# Patient Record
Sex: Female | Born: 1935
Health system: Southern US, Community
[De-identification: ages and names within clinical notes are randomized; demographics above are authoritative.]

## PROBLEM LIST (undated history)

## (undated) DIAGNOSIS — H269 Unspecified cataract: Secondary | ICD-10-CM

## (undated) DIAGNOSIS — M1990 Unspecified osteoarthritis, unspecified site: Secondary | ICD-10-CM

## (undated) DIAGNOSIS — I10 Essential (primary) hypertension: Secondary | ICD-10-CM

## (undated) DIAGNOSIS — E7800 Pure hypercholesterolemia, unspecified: Secondary | ICD-10-CM

---

## 2000-01-08 ENCOUNTER — Ambulatory Visit (HOSPITAL_COMMUNITY): Admission: RE | Admit: 2000-01-08 | Discharge: 2000-01-08 | Payer: Self-pay | Admitting: Family Medicine

## 2000-01-08 ENCOUNTER — Encounter: Payer: Self-pay | Admitting: Family Medicine

## 2000-07-07 ENCOUNTER — Ambulatory Visit (HOSPITAL_COMMUNITY): Admission: RE | Admit: 2000-07-07 | Discharge: 2000-07-07 | Payer: Self-pay | Admitting: Family Medicine

## 2000-07-07 ENCOUNTER — Other Ambulatory Visit: Admission: RE | Admit: 2000-07-07 | Discharge: 2000-07-07 | Payer: Self-pay | Admitting: Family Medicine

## 2002-06-19 ENCOUNTER — Other Ambulatory Visit: Admission: RE | Admit: 2002-06-19 | Discharge: 2002-06-19 | Payer: Self-pay | Admitting: Family Medicine

## 2002-07-20 ENCOUNTER — Encounter: Payer: Self-pay | Admitting: Family Medicine

## 2002-07-20 ENCOUNTER — Ambulatory Visit (HOSPITAL_COMMUNITY): Admission: RE | Admit: 2002-07-20 | Discharge: 2002-07-20 | Payer: Self-pay | Admitting: Family Medicine

## 2004-01-16 ENCOUNTER — Encounter: Admission: RE | Admit: 2004-01-16 | Discharge: 2004-01-16 | Payer: Self-pay | Admitting: Family Medicine

## 2004-02-27 ENCOUNTER — Ambulatory Visit (HOSPITAL_COMMUNITY): Admission: RE | Admit: 2004-02-27 | Discharge: 2004-02-27 | Payer: Self-pay | Admitting: Gastroenterology

## 2004-10-15 ENCOUNTER — Ambulatory Visit (HOSPITAL_COMMUNITY): Admission: RE | Admit: 2004-10-15 | Discharge: 2004-10-15 | Payer: Self-pay | Admitting: Ophthalmology

## 2007-02-08 ENCOUNTER — Ambulatory Visit (HOSPITAL_COMMUNITY): Admission: RE | Admit: 2007-02-08 | Discharge: 2007-02-08 | Payer: Self-pay | Admitting: Ophthalmology

## 2010-08-03 ENCOUNTER — Encounter: Admission: RE | Admit: 2010-08-03 | Discharge: 2010-08-03 | Payer: Self-pay | Admitting: Family Medicine

## 2011-05-14 NOTE — Op Note (Signed)
NAME:  Amber Freeman, Amber Freeman NO.:  [id number]   MEDICAL RECORD NO.:  [id number]          PATIENT TYPE:  OIB   LOCATION:  2899                         FACILITY:  [facility]   PHYSICIAN:  Salley Scarlet., M.D.DATE OF BIRTH:  02/03/1932   DATE OF PROCEDURE:  DATE OF DISCHARGE:  10/15/2004                                 OPERATIVE REPORT   PREOPERATIVE DIAGNOSIS:  Immature cataract, right eye.   POSTOPERATIVE DIAGNOSIS:  Immature cataract, right eye.   PROCEDURE:  Kelman phacoemulsification cataract, right eye with intraocular  lens implantation.   SURGEON:  Nadyne Coombes, M.D.   ANESTHESIA:  Local using 2% Xylocaine, Marcaine 0.75% with Wydase.   JUSTIFICATION FOR PROCEDURE:  This is a 77 year old lady who complains of  blurring of vision with difficulty seeing to read. She was evaluated and  found to have bilateral immature cataracts, slightly worse on the right than  the left.  Her vision was best corrected to 20/60 on the right, 20/50 on the  left.  Cataract extraction with intraocular lens implantation was  recommended. She was admitted at this time for the appropriate procedure.   DESCRIPTION OF PROCEDURE:  Under the influence of IV sedation, the VanLint  akinesia and retrobulbar anesthesia were given.  The patient was prepped and  draped in the usual manner.  The lid speculum was inserted under the  upper/lower lid of the right eye and a 4-0 silk traction suture was passed  through the belly of the superior rectus muscle for traction.  A fornix  based conjunctival flap was turned and hemostasis was achieved with the use  of cautery.  An incision was made in the sclera approximately 1 mm from the  limbus. This incision was dissected down to clear cornea using the crescent  blade.  A side-port incision was made at the 1:30 o'clock position. Occucoat  was injected into the eye through this  side-port incision.  The anterior chamber was then entered through  the  corneoscleral incision at the 11:30 o'clock position using a 2.7 mm  keratome.   An anterior capsulotomy was then done using a bent 25 gauge needle.  The nucleus was hydrodissected using Xylocaine.  The KTP hand-piece was  fastened to the eye, and the nucleus was emulsified without difficulty.  The  residual cortical material was aspirated.  The posterior capsule was  polished using olive-tipped polisher.   The wound was widened slightly to accommodate a foldable, silicone lens.  This lens was seated into the eye behind the iris without difficulty.  The  anterior chamber was reformed and the pupil was constricted using Miochol.  The ellipse of the wound were hydrated and tested to make sure that there  was no leak.  After ascertaining that there was no leak,  the conjunctiva  was closed using thermal cautery.  One cc of Celestone, 0.5 cc of gentamicin  were injected in the subconjunctiva.  Maxitrol ophthalmic ointment and  Pilopine ointment were applied along with a patch and Fox shield.  The  patient tolerated the procedure well and was discharged to  the  postanesthesia recovery room in satisfactory condition.   She was instructed to rest today, to take Vicodin every 4 hours as needed  for pain and to see me in the office tomorrow for further evaluation.   DISCHARGE DIAGNOSIS:  Immature cataract in the right eye.       TB/MEDQ  D:  10/15/2004  T:  10/15/2004  Job:  [id number]

## 2011-05-14 NOTE — Op Note (Signed)
NAME:  Amber Freeman, Amber Freeman                           ACCOUNT NO.:  [id number]   MEDICAL RECORD NO.:  [id number]                   PATIENT TYPE:  AMB   LOCATION:  ENDO                                 FACILITY:  [facility]   PHYSICIAN:  Graylin Shiver, M.D.                DATE OF BIRTH:  [birth year]   DATE OF PROCEDURE:  02/27/2004  DATE OF DISCHARGE:                                 OPERATIVE REPORT   PROCEDURE:  Colonoscopy.   INDICATIONS FOR PROCEDURE:  Chronic constipation, screening.   Informed consent was obtained after explanation of the risks of bleeding,  infection and perforation.   PREMEDICATION:  Fentanyl 60 mcg IV, Versed 7 mg IV.   DESCRIPTION OF PROCEDURE:  With the patient in the left lateral decubitus  position, a rectal exam was performed and no masses were felt. The Olympus  colonoscope was then inserted into the rectum and advanced around the colon  to the cecum. Cecal landmarks were identified. The colon was extremely  tortous.  I had to place the patient on her right side and on her back to  achieve intubation of the cecum as the colon was extremely tortous.  The  scope was brought out visualizing the colonic mucosa.  The cecum and  ascending colon looked normal. The transverse colon looked normal.  The  descending colon, sigmoid and rectum looked normal. She tolerated the  procedure well without complications.   IMPRESSION:  Normal colonoscopy to the cecum with a very tortuous colon  present.                                               Graylin Shiver, M.D.    Germain Osgood  D:  02/27/2004  T:  02/27/2004  Job:  [id number]   cc:   Renaye Rakers, M.D.  [phone] [address]  [place]  [place] [address]  Fax: [phone]

## 2011-06-30 ENCOUNTER — Emergency Department (HOSPITAL_COMMUNITY)
Admission: EM | Admit: 2011-06-30 | Discharge: 2011-06-30 | Disposition: A | Discharge status: Home / Self Care | Payer: Medicare Other | Attending: Emergency Medicine | Admitting: Emergency Medicine

## 2011-06-30 ENCOUNTER — Emergency Department (HOSPITAL_COMMUNITY): Payer: Medicare Other

## 2011-06-30 DIAGNOSIS — M25569 Pain in unspecified knee: Secondary | ICD-10-CM | POA: Insufficient documentation

## 2011-06-30 DIAGNOSIS — I10 Essential (primary) hypertension: Secondary | ICD-10-CM | POA: Insufficient documentation

## 2011-06-30 DIAGNOSIS — E7800 Pure hypercholesterolemia, unspecified: Secondary | ICD-10-CM | POA: Insufficient documentation

## 2011-12-06 ENCOUNTER — Other Ambulatory Visit: Payer: Self-pay | Admitting: Otolaryngology

## 2011-12-06 DIAGNOSIS — K1120 Sialoadenitis, unspecified: Secondary | ICD-10-CM

## 2011-12-10 ENCOUNTER — Other Ambulatory Visit: Payer: Medicare Other

## 2011-12-13 ENCOUNTER — Ambulatory Visit
Admission: RE | Admit: 2011-12-13 | Discharge: 2011-12-13 | Disposition: A | Discharge status: Home / Self Care | Payer: Medicare Other | Source: Ambulatory Visit | Attending: Otolaryngology | Admitting: Otolaryngology

## 2011-12-13 DIAGNOSIS — K1120 Sialoadenitis, unspecified: Secondary | ICD-10-CM

## 2013-06-11 ENCOUNTER — Other Ambulatory Visit (HOSPITAL_COMMUNITY): Payer: Self-pay | Admitting: Family Medicine

## 2013-06-11 DIAGNOSIS — M25561 Pain in right knee: Secondary | ICD-10-CM

## 2013-06-11 DIAGNOSIS — M7989 Other specified soft tissue disorders: Secondary | ICD-10-CM

## 2013-06-12 ENCOUNTER — Ambulatory Visit (HOSPITAL_COMMUNITY)
Admission: RE | Admit: 2013-06-12 | Discharge: 2013-06-12 | Disposition: A | Discharge status: Home / Self Care | Payer: Medicare Other | Source: Ambulatory Visit | Attending: Family Medicine | Admitting: Family Medicine

## 2013-06-12 DIAGNOSIS — M7989 Other specified soft tissue disorders: Secondary | ICD-10-CM

## 2013-06-12 DIAGNOSIS — M25561 Pain in right knee: Secondary | ICD-10-CM

## 2013-06-12 DIAGNOSIS — M79609 Pain in unspecified limb: Secondary | ICD-10-CM | POA: Insufficient documentation

## 2013-06-12 NOTE — Progress Notes (Signed)
Right lower extremity venous duplex completed.  Right:  No evidence of DVT, superficial thrombosis, or Baker's cyst.  Left:  Negative for DVT in the common femoral vein.  

## 2013-07-14 ENCOUNTER — Emergency Department (HOSPITAL_COMMUNITY)
Admission: EM | Admit: 2013-07-14 | Discharge: 2013-07-15 | Disposition: A | Discharge status: Home / Self Care | Payer: Medicare Other | Attending: Emergency Medicine | Admitting: Emergency Medicine

## 2013-07-14 ENCOUNTER — Encounter (HOSPITAL_COMMUNITY): Payer: Self-pay | Admitting: Emergency Medicine

## 2013-07-14 ENCOUNTER — Emergency Department (HOSPITAL_COMMUNITY): Payer: Medicare Other

## 2013-07-14 DIAGNOSIS — I10 Essential (primary) hypertension: Secondary | ICD-10-CM | POA: Insufficient documentation

## 2013-07-14 DIAGNOSIS — M129 Arthropathy, unspecified: Secondary | ICD-10-CM | POA: Insufficient documentation

## 2013-07-14 DIAGNOSIS — Z79899 Other long term (current) drug therapy: Secondary | ICD-10-CM | POA: Insufficient documentation

## 2013-07-14 DIAGNOSIS — R109 Unspecified abdominal pain: Secondary | ICD-10-CM

## 2013-07-14 DIAGNOSIS — R1915 Other abnormal bowel sounds: Secondary | ICD-10-CM | POA: Insufficient documentation

## 2013-07-14 DIAGNOSIS — E7800 Pure hypercholesterolemia, unspecified: Secondary | ICD-10-CM | POA: Insufficient documentation

## 2013-07-14 HISTORY — DX: Essential (primary) hypertension: I10

## 2013-07-14 HISTORY — DX: Pure hypercholesterolemia, unspecified: E78.00

## 2013-07-14 HISTORY — DX: Unspecified osteoarthritis, unspecified site: M19.90

## 2013-07-14 LAB — COMPREHENSIVE METABOLIC PANEL
ALT: 12 U/L (ref 0–35)
AST: 17 U/L (ref 0–37)
Albumin: 4 g/dL (ref 3.5–5.2)
Alkaline Phosphatase: 78 U/L (ref 39–117)
BUN: 10 mg/dL (ref 6–23)
CO2: 28 mEq/L (ref 19–32)
Calcium: 10.2 mg/dL (ref 8.4–10.5)
Chloride: 97 mEq/L (ref 96–112)
Creatinine, Ser: 0.83 mg/dL (ref 0.50–1.10)
GFR calc Af Amer: 76 mL/min — ABNORMAL LOW (ref >90)
GFR calc non Af Amer: 65 mL/min — ABNORMAL LOW (ref >90)
Glucose, Bld: 140 mg/dL — ABNORMAL HIGH (ref 70–99)
Potassium: 3.5 mEq/L (ref 3.5–5.1)
Sodium: 136 mEq/L (ref 135–145)
Total Bilirubin: 0.4 mg/dL (ref 0.3–1.2)
Total Protein: 7.6 g/dL (ref 6.0–8.3)

## 2013-07-14 LAB — CBC WITH DIFFERENTIAL/PLATELET
Basophils Absolute: 0 10*3/uL (ref 0.0–0.1)
Basophils Relative: 0 % (ref 0–1)
Eosinophils Absolute: 0 10*3/uL (ref 0.0–0.7)
Eosinophils Relative: 1 % (ref 0–5)
HCT: 37.6 % (ref 36.0–46.0)
Hemoglobin: 12.4 g/dL (ref 12.0–15.0)
Lymphocytes Relative: 22 % (ref 12–46)
Lymphs Abs: 1.1 10*3/uL (ref 0.7–4.0)
MCH: 30.8 pg (ref 26.0–34.0)
MCHC: 33 g/dL (ref 30.0–36.0)
MCV: 93.5 fL (ref 78.0–100.0)
Monocytes Absolute: 0.4 10*3/uL (ref 0.1–1.0)
Monocytes Relative: 7 % (ref 3–12)
Neutro Abs: 3.5 10*3/uL (ref 1.7–7.7)
Neutrophils Relative %: 71 % (ref 43–77)
Platelets: 176 10*3/uL (ref 150–400)
RBC: 4.02 MIL/uL (ref 3.87–5.11)
RDW: 13.9 % (ref 11.5–15.5)
WBC: 5 10*3/uL (ref 4.0–10.5)

## 2013-07-14 LAB — POCT I-STAT TROPONIN I: Troponin i, poc: 0 ng/mL (ref 0.00–0.08)

## 2013-07-14 LAB — LIPASE, BLOOD: Lipase: 18 U/L (ref 11–59)

## 2013-07-14 NOTE — ED Provider Notes (Signed)
History    CSN: [id number] Arrival date & time 07/14/13  2138  First MD Initiated Contact with Patient 07/14/13 2248     Chief Complaint  Patient presents with  . Abdominal Pain   (Consider location/radiation/quality/duration/timing/severity/associated sxs/prior Treatment) HPI Comments: This afternoon developed epigastric and bilateral upper abdominal pain that has been persistent since.  Denies N/V/D SOB diaphoresis Last BM was 2 days ago and normal for patient.  Does report burping frequently   Patient is a 79 y.o. female presenting with abdominal pain. The history is provided by the patient.  Abdominal Pain This is a new problem. The current episode started today. The problem occurs constantly. The problem has been unchanged. Associated symptoms include abdominal pain. Pertinent negatives include no change in bowel habit, chest pain, coughing, diaphoresis, fever, nausea, urinary symptoms or vomiting. The symptoms are aggravated by exertion. She has tried nothing for the symptoms. The treatment provided no relief.   Past Medical History  Diagnosis Date  . Hypertension   . Hypercholesteremia   . Arthritis    History reviewed. No pertinent past surgical history. No family history on file. History  Substance Use Topics  . Smoking status: Never Smoker   . Smokeless tobacco: Never Used  . Alcohol Use: No   OB History   Grav Para Term Preterm Abortions TAB SAB Ect Mult Living                 Review of Systems  Constitutional: Negative for fever and diaphoresis.  Respiratory: Negative for cough and shortness of breath.   Cardiovascular: Negative for chest pain.  Gastrointestinal: Positive for abdominal pain. Negative for nausea, vomiting, diarrhea, constipation, blood in stool, anal bleeding and change in bowel habit.  Genitourinary: Negative for dysuria.  All other systems reviewed and are negative.    Allergies  Review of patient's allergies indicates no known  allergies.  Home Medications   Current Outpatient Rx  Name  Route  Sig  Dispense  Refill  . HYDROcodone-acetaminophen (NORCO/VICODIN) 5-325 MG per tablet   Oral   Take 1 tablet by mouth every 6 (six) hours as needed for pain.         . meloxicam (MOBIC) 15 MG tablet   Oral   Take 15 mg by mouth every morning.         . rosuvastatin (CRESTOR) 20 MG tablet   Oral   Take 20 mg by mouth every evening.         . ondansetron (ZOFRAN) 4 MG tablet   Oral   Take 1 tablet (4 mg total) by mouth every 6 (six) hours.   12 tablet   0    BP 149/58  Pulse 83  Temp(Src) 98.4 F (36.9 C) (Oral)  Resp 17  Ht 5\' 5"  (1.651 m)  Wt 150 lb (68.04 kg)  BMI 24.96 kg/m2  SpO2 100% Physical Exam  Vitals reviewed. Constitutional: She is oriented to person, place, and time. She appears well-developed and well-nourished.  HENT:  Head: Normocephalic.  Eyes: Pupils are equal, round, and reactive to light.  Neck: Normal range of motion.  Cardiovascular: Normal rate.   Pulmonary/Chest: Effort normal.  Abdominal: Soft. She exhibits no distension. Bowel sounds are decreased. There is tenderness.  Musculoskeletal: Normal range of motion.  Neurological: She is alert and oriented to person, place, and time.  Skin: Skin is warm. No rash noted. No erythema.    ED Course  Procedures (including critical care time) Labs Reviewed  COMPREHENSIVE METABOLIC PANEL - Abnormal; Notable for the following:    Glucose, Bld 140 (*)    GFR calc non Af Amer 65 (*)    GFR calc Af Amer 76 (*)    All other components within normal limits  URINALYSIS, ROUTINE W REFLEX MICROSCOPIC - Abnormal; Notable for the following:    APPearance CLOUDY (*)    Specific Gravity, Urine 1.035 (*)    Hgb urine dipstick LARGE (*)    Leukocytes, UA SMALL (*)    All other components within normal limits  URINE MICROSCOPIC-ADD ON - Abnormal; Notable for the following:    Bacteria, UA MANY (*)    Crystals CA OXALATE CRYSTALS (*)     All other components within normal limits  URINE CULTURE  CBC WITH DIFFERENTIAL  LIPASE, BLOOD  POCT I-STAT TROPONIN I   Ct Abdomen Pelvis W Contrast  07/15/2013   *RADIOLOGY REPORT*  Clinical Data: . Mid - left abdominal pain  CT ABDOMEN AND PELVIS WITH CONTRAST  Technique:  Multidetector CT imaging of the abdomen and pelvis was performed following the standard protocol during bolus administration of intravenous contrast.  Contrast: 100 ml of Omnipaque-300  Comparison: Prior radiograph performed earlier on the same day.  Findings: Subpleural linear opacities within both lung bases is most consistent with subsegmental atelectasis.  The partially visualized lungs are otherwise clear.  The liver, gallbladder, spleen, adrenal glands, and pancreas demonstrate a normal contrast enhanced appearance.  9 mm hypodense cyst is present within the mid left kidney.  Left kidney is otherwise unremarkable without evidence of obstruction or nephrolithiasis.  The right kidney is unremarkable without evidence of hydronephrosis, nephrolithiasis, or renal mass.  The stomach is mildly prominent distended with enteric contrast material.  The cecum is mildly prominent as well in the right upper quadrant.  No dilated loops of small bowel to suggest obstruction are identified.  The colon is unremarkable without evidence of obstruction.  No wall thickening or enhancement to suggest underlying inflammation is the appendix is not densely visualized, however, no inflammatory changes are seen within the right lower quadrant to suggest acute appendicitis.  A small amount of hypodense fluid signal intensity is seen within the endometrial cavity, somewhat unusual for a patient of this age (series 2, image 1).  The ovaries are grossly unremarkable.  The bladder is normal.  No mass lesions are seen within the pelvis.  There is no free intraperitoneal air or fluid.  No pathologically enlarged intra-abdominal or pelvic lymph nodes are seen.   No acute osseous abnormalities are identified. Degenerative vacuum disc phenomenon is seen at the L4-5 level. Unilateral pars defect is present at the L4-5 level on the right.  IMPRESSION:     1.    Mild prominence of the cecum and stomach without overt       evidence of small or large bowel obstruction.  No acute intra-       abdominal or pelvic pathology identified.        2.    Small amount of fluid/blood within the endometrial cavity, somewhat unusual for a patient of this age.  Correlation with symptoms with gynecologic follow-up could be performed as clinically indicated.  Original Report Authenticated By: Rise Mu, M.D.   Dg Abd Acute W/chest  07/14/2013   *RADIOLOGY REPORT*  Clinical Data: Mid abdominal pain for 1 day.  ACUTE ABDOMEN SERIES (ABDOMEN 2 VIEW & CHEST 1 VIEW)  Comparison: Chest radiograph performed 10/12/2004  Findings: The lungs are  well-aerated and clear.  There is no evidence of focal opacification, pleural effusion or pneumothorax. The cardiomediastinal silhouette is within normal limits.  The visualized bowel gas pattern is nonspecific.  There is distension of the ascending and transverse colon, without definite evidence for distal obstruction.  Scattered stool and fluid are seen within the colon.  No small bowel dilatation is seen.  No free intra-abdominal air is identified on the provided upright view.  No acute osseous abnormalities are seen; the sacroiliac joints are unremarkable in appearance.  There is mild axial joint space narrowing at the right hip.  IMPRESSION:  1.  Nonspecific bowel gas pattern; distension of the ascending and transverse colon, without definite evidence for distal obstruction. No free intra-abdominal air seen. 2.  Mild degenerative change noted at the right hip joint. 3.  No acute cardiopulmonary process identified.   Original Report Authenticated By: Tonia Ghent, M.D.   1. Abdominal  pain, other specified site     MDM  Explained the CT  Results and need fro follow up evaluation Patient will make appointment with PCP and follow through, through her   Arman Filter, NP 07/15/13 0235

## 2013-07-14 NOTE — ED Notes (Signed)
Pt reports 8/10 left/centralized abdominal pain that began at approximately 1500. Pt denies N/V/D. Pt reports her last bowel movement was two days ago, which she reports as normal and her normal pattern.

## 2013-07-14 NOTE — ED Notes (Signed)
Patient transported to X-ray 

## 2013-07-15 ENCOUNTER — Emergency Department (HOSPITAL_COMMUNITY): Payer: Medicare Other

## 2013-07-15 LAB — URINALYSIS, ROUTINE W REFLEX MICROSCOPIC
Bilirubin Urine: NEGATIVE
Glucose, UA: NEGATIVE mg/dL
Ketones, ur: NEGATIVE mg/dL
Nitrite: NEGATIVE
Protein, ur: NEGATIVE mg/dL
Specific Gravity, Urine: 1.035 — ABNORMAL HIGH (ref 1.005–1.030)
Urobilinogen, UA: 0.2 mg/dL (ref 0.0–1.0)
pH: 5 (ref 5.0–8.0)

## 2013-07-15 LAB — URINE MICROSCOPIC-ADD ON

## 2013-07-15 MED ORDER — MORPHINE SULFATE 4 MG/ML IJ SOLN
4.0000 mg | Freq: Once | INTRAMUSCULAR | Status: AC
  Administered 2013-07-15: 4 mg via INTRAVENOUS
  Filled 2013-07-15: qty 1

## 2013-07-15 MED ORDER — IOHEXOL 300 MG/ML  SOLN
50.0000 mL | Freq: Once | INTRAMUSCULAR | Status: AC | PRN
  Administered 2013-07-15: 50 mL via ORAL

## 2013-07-15 MED ORDER — ONDANSETRON HCL 4 MG/2ML IJ SOLN
4.0000 mg | Freq: Once | INTRAMUSCULAR | Status: AC
  Administered 2013-07-15: 4 mg via INTRAVENOUS
  Filled 2013-07-15: qty 2

## 2013-07-15 MED ORDER — IOHEXOL 300 MG/ML  SOLN
100.0000 mL | Freq: Once | INTRAMUSCULAR | Status: AC | PRN
  Administered 2013-07-15: 100 mL via INTRAVENOUS

## 2013-07-15 MED ORDER — ONDANSETRON HCL 4 MG PO TABS: 4.0000 mg | ORAL_TABLET | Freq: Four times a day (QID) | ORAL | Status: DC

## 2013-07-17 LAB — URINE CULTURE
Colony Count: NO GROWTH
Culture: NO GROWTH

## 2013-07-18 NOTE — ED Provider Notes (Signed)
Medical screening examination/treatment/procedure(s) were performed by non-physician practitioner and as supervising physician I was immediately available for consultation/collaboration.   Candyce Churn, MD 07/18/13 [phone]

## 2015-01-03 DIAGNOSIS — R928 Other abnormal and inconclusive findings on diagnostic imaging of breast: Secondary | ICD-10-CM | POA: Diagnosis not present

## 2015-01-09 DIAGNOSIS — H40053 Ocular hypertension, bilateral: Secondary | ICD-10-CM | POA: Diagnosis not present

## 2015-03-17 DIAGNOSIS — E0800 Diabetes mellitus due to underlying condition with hyperosmolarity without nonketotic hyperglycemic-hyperosmolar coma (NKHHC): Secondary | ICD-10-CM | POA: Diagnosis not present

## 2015-03-17 DIAGNOSIS — I10 Essential (primary) hypertension: Secondary | ICD-10-CM | POA: Diagnosis not present

## 2015-03-19 DIAGNOSIS — E790 Hyperuricemia without signs of inflammatory arthritis and tophaceous disease: Secondary | ICD-10-CM | POA: Diagnosis not present

## 2015-03-19 DIAGNOSIS — M0510 Rheumatoid lung disease with rheumatoid arthritis of unspecified site: Secondary | ICD-10-CM | POA: Diagnosis not present

## 2015-03-19 DIAGNOSIS — I10 Essential (primary) hypertension: Secondary | ICD-10-CM | POA: Diagnosis not present

## 2015-06-09 DIAGNOSIS — E0800 Diabetes mellitus due to underlying condition with hyperosmolarity without nonketotic hyperglycemic-hyperosmolar coma (NKHHC): Secondary | ICD-10-CM | POA: Diagnosis not present

## 2015-06-09 DIAGNOSIS — I10 Essential (primary) hypertension: Secondary | ICD-10-CM | POA: Diagnosis not present

## 2015-06-09 DIAGNOSIS — E780 Pure hypercholesterolemia: Secondary | ICD-10-CM | POA: Diagnosis not present

## 2015-06-11 DIAGNOSIS — M061 Adult-onset Still's disease: Secondary | ICD-10-CM | POA: Diagnosis not present

## 2015-06-11 DIAGNOSIS — E790 Hyperuricemia without signs of inflammatory arthritis and tophaceous disease: Secondary | ICD-10-CM | POA: Diagnosis not present

## 2015-06-11 DIAGNOSIS — I10 Essential (primary) hypertension: Secondary | ICD-10-CM | POA: Diagnosis not present

## 2015-06-11 DIAGNOSIS — E780 Pure hypercholesterolemia: Secondary | ICD-10-CM | POA: Diagnosis not present

## 2015-10-15 DIAGNOSIS — I10 Essential (primary) hypertension: Secondary | ICD-10-CM | POA: Diagnosis not present

## 2015-10-15 DIAGNOSIS — E790 Hyperuricemia without signs of inflammatory arthritis and tophaceous disease: Secondary | ICD-10-CM | POA: Diagnosis not present

## 2015-10-15 DIAGNOSIS — Z0000 Encounter for general adult medical examination without abnormal findings: Secondary | ICD-10-CM | POA: Diagnosis not present

## 2015-10-15 DIAGNOSIS — M130 Polyarthritis, unspecified: Secondary | ICD-10-CM | POA: Diagnosis not present

## 2016-01-07 DIAGNOSIS — B373 Candidiasis of vulva and vagina: Secondary | ICD-10-CM | POA: Diagnosis not present

## 2016-02-09 DIAGNOSIS — H40053 Ocular hypertension, bilateral: Secondary | ICD-10-CM | POA: Diagnosis not present

## 2016-02-09 DIAGNOSIS — H25812 Combined forms of age-related cataract, left eye: Secondary | ICD-10-CM | POA: Diagnosis not present

## 2016-02-16 DIAGNOSIS — E0800 Diabetes mellitus due to underlying condition with hyperosmolarity without nonketotic hyperglycemic-hyperosmolar coma (NKHHC): Secondary | ICD-10-CM | POA: Diagnosis not present

## 2016-02-16 DIAGNOSIS — I10 Essential (primary) hypertension: Secondary | ICD-10-CM | POA: Diagnosis not present

## 2016-02-18 DIAGNOSIS — Z0000 Encounter for general adult medical examination without abnormal findings: Secondary | ICD-10-CM | POA: Diagnosis not present

## 2016-02-18 DIAGNOSIS — I10 Essential (primary) hypertension: Secondary | ICD-10-CM | POA: Diagnosis not present

## 2016-02-18 DIAGNOSIS — M130 Polyarthritis, unspecified: Secondary | ICD-10-CM | POA: Diagnosis not present

## 2016-02-18 DIAGNOSIS — E7800 Pure hypercholesterolemia, unspecified: Secondary | ICD-10-CM | POA: Diagnosis not present

## 2016-02-18 DIAGNOSIS — E790 Hyperuricemia without signs of inflammatory arthritis and tophaceous disease: Secondary | ICD-10-CM | POA: Diagnosis not present

## 2016-05-22 DIAGNOSIS — R55 Syncope and collapse: Secondary | ICD-10-CM | POA: Diagnosis not present

## 2016-05-22 DIAGNOSIS — I10 Essential (primary) hypertension: Secondary | ICD-10-CM | POA: Diagnosis not present

## 2016-05-22 DIAGNOSIS — R404 Transient alteration of awareness: Secondary | ICD-10-CM | POA: Diagnosis not present

## 2016-05-22 DIAGNOSIS — E785 Hyperlipidemia, unspecified: Secondary | ICD-10-CM | POA: Diagnosis not present

## 2016-06-21 DIAGNOSIS — E785 Hyperlipidemia, unspecified: Secondary | ICD-10-CM | POA: Diagnosis not present

## 2016-06-21 DIAGNOSIS — E790 Hyperuricemia without signs of inflammatory arthritis and tophaceous disease: Secondary | ICD-10-CM | POA: Diagnosis not present

## 2016-06-21 DIAGNOSIS — R7309 Other abnormal glucose: Secondary | ICD-10-CM | POA: Diagnosis not present

## 2016-06-21 DIAGNOSIS — I10 Essential (primary) hypertension: Secondary | ICD-10-CM | POA: Diagnosis not present

## 2016-06-23 DIAGNOSIS — E790 Hyperuricemia without signs of inflammatory arthritis and tophaceous disease: Secondary | ICD-10-CM | POA: Diagnosis not present

## 2016-06-23 DIAGNOSIS — I10 Essential (primary) hypertension: Secondary | ICD-10-CM | POA: Diagnosis not present

## 2016-06-23 DIAGNOSIS — E785 Hyperlipidemia, unspecified: Secondary | ICD-10-CM | POA: Diagnosis not present

## 2016-06-23 DIAGNOSIS — M130 Polyarthritis, unspecified: Secondary | ICD-10-CM | POA: Diagnosis not present

## 2016-11-22 DIAGNOSIS — I10 Essential (primary) hypertension: Secondary | ICD-10-CM | POA: Diagnosis not present

## 2016-11-22 DIAGNOSIS — Z79899 Other long term (current) drug therapy: Secondary | ICD-10-CM | POA: Diagnosis not present

## 2016-11-24 DIAGNOSIS — I10 Essential (primary) hypertension: Secondary | ICD-10-CM | POA: Diagnosis not present

## 2016-11-24 DIAGNOSIS — M130 Polyarthritis, unspecified: Secondary | ICD-10-CM | POA: Diagnosis not present

## 2016-11-24 DIAGNOSIS — E790 Hyperuricemia without signs of inflammatory arthritis and tophaceous disease: Secondary | ICD-10-CM | POA: Diagnosis not present

## 2017-03-30 DIAGNOSIS — E790 Hyperuricemia without signs of inflammatory arthritis and tophaceous disease: Secondary | ICD-10-CM | POA: Diagnosis not present

## 2017-03-30 DIAGNOSIS — E7800 Pure hypercholesterolemia, unspecified: Secondary | ICD-10-CM | POA: Diagnosis not present

## 2017-03-30 DIAGNOSIS — E559 Vitamin D deficiency, unspecified: Secondary | ICD-10-CM | POA: Diagnosis not present

## 2017-03-30 DIAGNOSIS — E785 Hyperlipidemia, unspecified: Secondary | ICD-10-CM | POA: Diagnosis not present

## 2017-03-30 DIAGNOSIS — M130 Polyarthritis, unspecified: Secondary | ICD-10-CM | POA: Diagnosis not present

## 2017-03-30 DIAGNOSIS — I10 Essential (primary) hypertension: Secondary | ICD-10-CM | POA: Diagnosis not present

## 2017-06-09 DIAGNOSIS — H40053 Ocular hypertension, bilateral: Secondary | ICD-10-CM | POA: Diagnosis not present

## 2017-09-14 ENCOUNTER — Encounter (HOSPITAL_COMMUNITY): Payer: Self-pay

## 2017-09-14 ENCOUNTER — Emergency Department (HOSPITAL_COMMUNITY)
Admission: EM | Admit: 2017-09-14 | Discharge: 2017-09-14 | Disposition: A | Discharge status: Home / Self Care | Payer: Medicare Other | Attending: Emergency Medicine | Admitting: Emergency Medicine

## 2017-09-14 DIAGNOSIS — I10 Essential (primary) hypertension: Secondary | ICD-10-CM | POA: Insufficient documentation

## 2017-09-14 DIAGNOSIS — E876 Hypokalemia: Secondary | ICD-10-CM | POA: Insufficient documentation

## 2017-09-14 DIAGNOSIS — Z79899 Other long term (current) drug therapy: Secondary | ICD-10-CM | POA: Diagnosis not present

## 2017-09-14 DIAGNOSIS — R197 Diarrhea, unspecified: Secondary | ICD-10-CM | POA: Diagnosis not present

## 2017-09-14 DIAGNOSIS — K625 Hemorrhage of anus and rectum: Secondary | ICD-10-CM | POA: Diagnosis present

## 2017-09-14 LAB — CBC
HCT: 34.2 % — ABNORMAL LOW (ref 36.0–46.0)
Hemoglobin: 11.3 g/dL — ABNORMAL LOW (ref 12.0–15.0)
MCH: 30.4 pg (ref 26.0–34.0)
MCHC: 33 g/dL (ref 30.0–36.0)
MCV: 91.9 fL (ref 78.0–100.0)
Platelets: 230 10*3/uL (ref 150–400)
RBC: 3.72 MIL/uL — ABNORMAL LOW (ref 3.87–5.11)
RDW: 13.5 % (ref 11.5–15.5)
WBC: 5.7 10*3/uL (ref 4.0–10.5)

## 2017-09-14 LAB — ABO/RH: ABO/RH(D): O NEG

## 2017-09-14 LAB — C DIFFICILE QUICK SCREEN W PCR REFLEX
C Diff antigen: POSITIVE — AB
C Diff toxin: NEGATIVE

## 2017-09-14 LAB — COMPREHENSIVE METABOLIC PANEL
ALT: 20 U/L (ref 14–54)
AST: 20 U/L (ref 15–41)
Albumin: 3.7 g/dL (ref 3.5–5.0)
Alkaline Phosphatase: 64 U/L (ref 38–126)
Anion gap: 16 — ABNORMAL HIGH (ref 5–15)
BUN: 9 mg/dL (ref 6–20)
CO2: 25 mmol/L (ref 22–32)
Calcium: 9.4 mg/dL (ref 8.9–10.3)
Chloride: 97 mmol/L — ABNORMAL LOW (ref 101–111)
Creatinine, Ser: 1.08 mg/dL — ABNORMAL HIGH (ref 0.44–1.00)
GFR calc Af Amer: 53 mL/min — ABNORMAL LOW (ref >60)
GFR calc non Af Amer: 46 mL/min — ABNORMAL LOW (ref >60)
Glucose, Bld: 96 mg/dL (ref 65–99)
Potassium: 2.3 mmol/L — CL (ref 3.5–5.1)
Sodium: 138 mmol/L (ref 135–145)
Total Bilirubin: 0.8 mg/dL (ref 0.3–1.2)
Total Protein: 7 g/dL (ref 6.5–8.1)

## 2017-09-14 LAB — CLOSTRIDIUM DIFFICILE BY PCR: Toxigenic C. Difficile by PCR: POSITIVE — AB

## 2017-09-14 LAB — POC OCCULT BLOOD, ED: Fecal Occult Bld: POSITIVE — AB

## 2017-09-14 LAB — TYPE AND SCREEN
ABO/RH(D): O NEG
Antibody Screen: NEGATIVE

## 2017-09-14 MED ORDER — OXYCODONE-ACETAMINOPHEN 5-325 MG PO TABS: 1.0000 | ORAL_TABLET | Freq: Once | ORAL | Status: DC

## 2017-09-14 MED ORDER — POTASSIUM CHLORIDE 10 MEQ/100ML IV SOLN
10.0000 meq | INTRAVENOUS | Status: AC
  Administered 2017-09-14 (×2): 10 meq via INTRAVENOUS
  Filled 2017-09-14 (×2): qty 100

## 2017-09-14 MED ORDER — POTASSIUM CHLORIDE CRYS ER 20 MEQ PO TBCR
40.0000 meq | EXTENDED_RELEASE_TABLET | Freq: Once | ORAL | Status: AC
  Administered 2017-09-14: 40 meq via ORAL
  Filled 2017-09-14: qty 2

## 2017-09-14 MED ORDER — LOPERAMIDE HCL 2 MG PO CAPS: 2.0000 mg | ORAL_CAPSULE | Freq: Four times a day (QID) | ORAL | 0 refills | Status: DC | PRN

## 2017-09-14 MED ORDER — POTASSIUM CHLORIDE 10 MEQ/100ML IV SOLN
10.0000 meq | Freq: Once | INTRAVENOUS | Status: AC
  Administered 2017-09-14: 10 meq via INTRAVENOUS
  Filled 2017-09-14: qty 100

## 2017-09-14 NOTE — ED Triage Notes (Signed)
Pt states that she was recently placed on antibiotic that gave her diarrhea and yesterday noticed blood in her stool, bright red, denies n/v or abd pain

## 2017-09-14 NOTE — ED Provider Notes (Signed)
  Face-to-face evaluation   History: She is here for evaluation of diarrhea, unrelenting with use of Kaopectate and Pepto-Bismol.  She denies abdominal pain, anorexia or vomiting.  She denies chest pain or dizziness.  Physical exam: Alert elderly female.  No respiratory distress.  She is lucid.  She is tolerating oral nutrition.  Medical screening examination/treatment/procedure(s) were conducted as a shared visit with non-physician practitioner(s) and myself.  I personally evaluated the patient during the encounter   Mancel Bale, MD 09/15/17 1208

## 2017-09-14 NOTE — ED Provider Notes (Signed)
[facility]ER[facility]ENCY DEPT Provider Note   CSN: [id number] Arrival date & time: 09/14/17  4098     History   Chief Complaint Chief Complaint  Patient presents with  . Rectal Bleeding    HPI Amber Freeman is a 83 y.o. female.  HPI   83 year old female presents today with complaints of diarrhea and rectal bleeding.  Patient notes that on August 22 she was started on clindamycin after having teeth pulled.  She notes she was prescribed a seven-day course, notes she took approximately 4 days of that and discontinued.  She reports that approximately 4 days ago she started developing watery yellowish diarrhea with light red blood.  She notes yesterday she had a very faint amount of blood in her diarrhea.  She notes the episodes come shortly after eating, no diarrhea without p.o. intake.  She denies any fever, abdominal pain, history of the same.  She denies any aspirin, anticoagulants, or any other antiplatelets.  Patient denies any history of rectal bleeding, notes she had a colonoscopy approximately 5 years ago by a local GI group.  She denies any nausea or vomiting.  Patient notes that she supposed be taking potassium supplements but has not been taking these.     Past Medical History:  Diagnosis Date  . Arthritis   . Hypercholesteremia   . Hypertension     There are no active problems to display for this patient.   History reviewed. No pertinent surgical history.  OB History    No data available       Home Medications    Prior to Admission medications   Medication Sig Start Date End Date Taking? Authorizing Provider  HYDROcodone-acetaminophen (NORCO/VICODIN) 5-325 MG per tablet Take 1 tablet by mouth every 6 (six) hours as needed for pain.    [provider]  loperamide (IMODIUM) 2 MG capsule Take 1 capsule (2 mg total) by mouth 4 (four) times daily as needed for diarrhea or loose stools. 09/14/17   Annlouise Gerety, Tinnie Gens, PA-C  meloxicam (MOBIC) 15 MG tablet Take 15 mg by  mouth every morning.    [provider]  ondansetron (ZOFRAN) 4 MG tablet Take 1 tablet (4 mg total) by mouth every 6 (six) hours. 07/15/13   Earley Favor, NP  rosuvastatin (CRESTOR) 20 MG tablet Take 20 mg by mouth every evening.    [provider]    Family History No family history on file.  Social History Social History  Substance Use Topics  . Smoking status: Never Smoker  . Smokeless tobacco: Never Used  . Alcohol use No     Allergies   Patient has no known allergies.   Review of Systems Review of Systems  All other systems reviewed and are negative.   Physical Exam Updated Vital Signs BP 124/61   Pulse 66   Temp 98.3 F (36.8 C) (Oral)   Resp 14   SpO2 99%   Physical Exam  Constitutional: She is oriented to person, place, and time. She appears well-developed and well-nourished.  HENT:  Head: Normocephalic and atraumatic.  Eyes: Pupils are equal, round, and reactive to light. Conjunctivae are normal. Right eye exhibits no discharge. Left eye exhibits no discharge. No scleral icterus.  Neck: Normal range of motion. No JVD present. No tracheal deviation present.  Pulmonary/Chest: Effort normal. No stridor.  Abdominal: Soft. She exhibits no distension and no mass. There is no tenderness. There is no rebound and no guarding. No hernia.  Genitourinary:  Genitourinary Comments: No  obvious bleeding per rectum  Neurological: She is alert and oriented to person, place, and time. Coordination normal.  Psychiatric: She has a normal mood and affect. Her behavior is normal. Judgment and thought content normal.  Nursing note and vitals reviewed.    ED Treatments / Results  Labs (all labs ordered are listed, but only abnormal results are displayed) Labs Reviewed  C DIFFICILE QUICK SCREEN W PCR REFLEX - Abnormal; Notable for the following:       Result Value   C Diff antigen POSITIVE (*)    All other components within normal limits  CLOSTRIDIUM  DIFFICILE BY PCR - Abnormal; Notable for the following:    Toxigenic C Difficile by pcr POSITIVE (*)    All other components within normal limits  COMPREHENSIVE METABOLIC PANEL - Abnormal; Notable for the following:    Potassium 2.3 (*)    Chloride 97 (*)    Creatinine, Ser 1.08 (*)    GFR calc non Af Amer 46 (*)    GFR calc Af Amer 53 (*)    Anion gap 16 (*)    All other components within normal limits  CBC - Abnormal; Notable for the following:    RBC 3.72 (*)    Hemoglobin 11.3 (*)    HCT 34.2 (*)    All other components within normal limits  POC OCCULT BLOOD, ED - Abnormal; Notable for the following:    Fecal Occult Bld POSITIVE (*)    All other components within normal limits  POC OCCULT BLOOD, ED  TYPE AND SCREEN  ABO/RH    EKG  EKG Interpretation None       Radiology No results found.  Procedures Procedures (including critical care time)  Medications Ordered in ED Medications  potassium chloride 10 mEq in 100 mL IVPB (0 mEq Intravenous Stopped 09/14/17 1421)  potassium chloride 10 mEq in 100 mL IVPB (10 mEq Intravenous New Bag/Given 09/14/17 1631)  potassium chloride SA (K-DUR,KLOR-CON) CR tablet 40 mEq (40 mEq Oral Given 09/14/17 1505)     Initial Impression / Assessment and Plan / ED Course  I have reviewed the triage vital signs and the nursing notes.  Pertinent labs & imaging results that were available during my care of the patient were reviewed by me and considered in my medical decision making (see chart for details).      Final Clinical Impressions(s) / ED Diagnoses   Final diagnoses:  Diarrhea, unspecified type  Hypokalemia    Labs: CMP, CBC, type and screen, ABO Rh- potassium 2.3   Therapeutics: Potassium  Discharge Meds: Imodium  Assessment/Plan: 83 year old female presents today with complaints of diarrhea.  Patient had small amount of blood per rectum.  This is likely secondary to irritation from diarrhea.  She is afebrile nontoxic  with a soft abdomen.  Patient only having diarrhea after eating, no persistent diffuse watery diarrhea.  Patient's C. difficile came back with positive antigen, negative toxin.  No need for treatment at this time, patient will receive Imodium, she will follow-up with her primary care provider in 2 days for reassessment.  Patient was hypokalemic here, likely secondary to her not taking home potassium, HCTZ, and diarrhea.  She is instructed continue using potassium, follow-up with primary care return immediately with any new or worsening signs or symptoms.  Both the patient and her daughter verbalized understanding and agreement to today's plan and had no further questions or concerns at time of discharge.      New Prescriptions  New Prescriptions   LOPERAMIDE (IMODIUM) 2 MG CAPSULE    Take 1 capsule (2 mg total) by mouth 4 (four) times daily as needed for diarrhea or loose stools.     Eyvonne Mechanic, PA-C 09/14/17 1748    Mancel Bale, MD 09/15/17 [phone]

## 2017-09-14 NOTE — Discharge Instructions (Signed)
Please read attached information. If you experience any new or worsening signs or symptoms please return to the emergency room for evaluation. Please follow-up with your primary care provider in 2 days for reassessment.  Please use medication prescribed only as directed and discontinue taking if you have any concerning signs or symptoms.

## 2017-09-14 NOTE — ED Notes (Signed)
Patient verbalized understanding of discharge instructions and denies any further needs or questions at this time. VS stable. Patient ambulatory with steady gait.  

## 2017-10-07 DIAGNOSIS — M130 Polyarthritis, unspecified: Secondary | ICD-10-CM | POA: Diagnosis not present

## 2017-10-07 DIAGNOSIS — I10 Essential (primary) hypertension: Secondary | ICD-10-CM | POA: Diagnosis not present

## 2017-10-07 DIAGNOSIS — E790 Hyperuricemia without signs of inflammatory arthritis and tophaceous disease: Secondary | ICD-10-CM | POA: Diagnosis not present

## 2017-10-07 DIAGNOSIS — E785 Hyperlipidemia, unspecified: Secondary | ICD-10-CM | POA: Diagnosis not present

## 2017-10-12 DIAGNOSIS — R140 Abdominal distension (gaseous): Secondary | ICD-10-CM | POA: Diagnosis not present

## 2017-10-12 DIAGNOSIS — E785 Hyperlipidemia, unspecified: Secondary | ICD-10-CM | POA: Diagnosis not present

## 2017-10-12 DIAGNOSIS — I10 Essential (primary) hypertension: Secondary | ICD-10-CM | POA: Diagnosis not present

## 2018-01-08 ENCOUNTER — Other Ambulatory Visit: Payer: Self-pay

## 2018-01-08 ENCOUNTER — Emergency Department (HOSPITAL_COMMUNITY)
Admission: EM | Admit: 2018-01-08 | Discharge: 2018-01-08 | Disposition: A | Discharge status: Home / Self Care | Payer: Medicare Other | Attending: Emergency Medicine | Admitting: Emergency Medicine

## 2018-01-08 ENCOUNTER — Encounter (HOSPITAL_COMMUNITY): Payer: Self-pay | Admitting: *Deleted

## 2018-01-08 DIAGNOSIS — I10 Essential (primary) hypertension: Secondary | ICD-10-CM | POA: Insufficient documentation

## 2018-01-08 DIAGNOSIS — M25571 Pain in right ankle and joints of right foot: Secondary | ICD-10-CM | POA: Diagnosis not present

## 2018-01-08 DIAGNOSIS — M25561 Pain in right knee: Secondary | ICD-10-CM | POA: Diagnosis not present

## 2018-01-08 DIAGNOSIS — M1990 Unspecified osteoarthritis, unspecified site: Secondary | ICD-10-CM | POA: Insufficient documentation

## 2018-01-08 DIAGNOSIS — M25562 Pain in left knee: Secondary | ICD-10-CM | POA: Insufficient documentation

## 2018-01-08 DIAGNOSIS — Z79899 Other long term (current) drug therapy: Secondary | ICD-10-CM | POA: Diagnosis not present

## 2018-01-08 MED ORDER — HYDROCODONE-ACETAMINOPHEN 5-325 MG PO TABS
1.0000 | ORAL_TABLET | Freq: Once | ORAL | Status: AC
  Administered 2018-01-08: 1 via ORAL
  Filled 2018-01-08: qty 1

## 2018-01-08 MED ORDER — PREDNISONE 50 MG PO TABS: ORAL_TABLET | ORAL | 0 refills | Status: DC

## 2018-01-08 NOTE — ED Provider Notes (Signed)
[facility] EMERGENCY DEPARTMENT Provider Note   CSN: [id number] Arrival date & time: 01/08/18  1716     History   Chief Complaint Chief Complaint  Patient presents with  . Knee Pain  . Ankle Pain    HPI Amber Freeman is a 84 y.o. female with hx of arthritis who presents to the ED with  Bilateral knee and right ankle pain. Patient reports that since Christmas the pain has increased. In the past when she has had increased pain she went to her arthritis doctor and got injections of cortisone in her knees. Her rheumatologist is no longer there so she doesn't know where to go. She does have a PCP, Dr. Parke Simmers. Patient reports that the right knee hurts a lot worse than the left. Patient denies any other problems tonight.    HPI  Past Medical History:  Diagnosis Date  . Arthritis   . Hypercholesteremia   . Hypertension     There are no active problems to display for this patient.   History reviewed. No pertinent surgical history.  OB History    No data available       Home Medications    Prior to Admission medications   Medication Sig Start Date End Date Taking? Authorizing Provider  HYDROcodone-acetaminophen (NORCO/VICODIN) 5-325 MG per tablet Take 1 tablet by mouth every 6 (six) hours as needed for pain.    [provider]  loperamide (IMODIUM) 2 MG capsule Take 1 capsule (2 mg total) by mouth 4 (four) times daily as needed for diarrhea or loose stools. 09/14/17   Hedges, Tinnie Gens, PA-C  meloxicam (MOBIC) 15 MG tablet Take 15 mg by mouth every morning.    [provider]  ondansetron (ZOFRAN) 4 MG tablet Take 1 tablet (4 mg total) by mouth every 6 (six) hours. 07/15/13   Earley Favor, NP  predniSONE (DELTASONE) 50 MG tablet Take one tablet PO daily 01/08/18   Janne Napoleon, NP  rosuvastatin (CRESTOR) 20 MG tablet Take 20 mg by mouth every evening.    [provider]    Family History No family history on file.  Social  History Social History   Tobacco Use  . Smoking status: Never Smoker  . Smokeless tobacco: Never Used  Substance Use Topics  . Alcohol use: No  . Drug use: No     Allergies   Patient has no known allergies.   Review of Systems Review of Systems  Constitutional: Negative for chills and fever.  HENT: Negative.   Respiratory: Negative for shortness of breath.   Cardiovascular: Negative for chest pain.  Gastrointestinal: Negative for abdominal pain, diarrhea and vomiting.  Genitourinary: Negative for dysuria and urgency.  Musculoskeletal: Positive for arthralgias. Negative for back pain and neck pain.  Skin: Negative for wound.  Neurological: Negative for syncope and headaches.  Psychiatric/Behavioral: Negative for confusion.     Physical Exam Updated Vital Signs BP 140/69 (BP Location: Left Arm)   Pulse 84   Temp 97.7 F (36.5 C) (Oral)   Resp 16   Ht 5\' 4"  (1.626 m)   Wt 68 kg (150 lb)   SpO2 100%   BMI 25.75 kg/m   Physical Exam  Constitutional: She appears well-developed and well-nourished. No distress.  HENT:  Head: Normocephalic and atraumatic.  Eyes: EOM are normal.  Neck: Neck supple.  Cardiovascular: Normal rate.  Pulmonary/Chest: Effort normal.  Musculoskeletal:       Right knee: She exhibits swelling. She exhibits  normal range of motion, no deformity, no erythema, normal alignment and normal patellar mobility. Tenderness found.       Right ankle: She exhibits normal range of motion, no swelling, no ecchymosis, no deformity, no laceration and normal pulse. Tenderness. Medial malleolus tenderness found.       Legs: Pain with flexion and internal rotation of the right knee. Pedal pulses 2+, adequate circulation. I am able to do full passive range of motion of both knees but patient c/o pain with the movement of the right.   Neurological: She is alert.  Skin: Skin is warm and dry.  Psychiatric: She has a normal mood and affect. Her behavior is normal.   Nursing note and vitals reviewed.    ED Treatments / Results  Labs (all labs ordered are listed, but only abnormal results are displayed) Labs Reviewed - No data to display  Radiology No results found.  Procedures Procedures (including critical care time)  Medications Ordered in ED Medications  HYDROcodone-acetaminophen (NORCO/VICODIN) 5-325 MG per tablet 1 tablet (not administered)   Dr. Effie ShyWentz in to evaluate the patient. Discussed plan of care. Patient stable for d/c without other complaints. She will call her PCP in the morning for follow up. She will return here for any problems.   Initial Impression / Assessment and Plan / ED Course  I have reviewed the triage vital signs and the nursing notes.   Final Clinical Impressions(s) / ED Diagnoses   Final diagnoses:  Pain in both knees, unspecified chronicity  Acute right ankle pain  Arthritis    ED Discharge Orders        Ordered    predniSONE (DELTASONE) 50 MG tablet     01/08/18 1825       Damian Leavelleese, [place] Heights[place] M, NP 01/08/18 Berna [id number]    Mancel BaleWentz, Elliott, MD 01/09/18 [id number]

## 2018-01-08 NOTE — Discharge Instructions (Addendum)
Follow up with Dr. Parke SimmersBland tomorrow. Return here as needed.

## 2018-01-08 NOTE — ED Provider Notes (Signed)
  Face-to-face evaluation   History: She c/o Bilat. Knee pain for several days without trauma.   Physical exam: [place] knees with small effusions, R>L. Mild associcted crepitation. Fair AROM.  Medical screening examination/treatment/procedure(s) were conducted as a shared visit with non-physician practitioner(s) and myself.  I personally evaluated the patient during the encounter    Mancel BaleWentz, Zonnie Landen, MD 01/09/18 [phone]

## 2018-01-08 NOTE — ED Notes (Signed)
Declined W/C at D/C and was escorted to lobby by RN. 

## 2018-01-08 NOTE — ED Triage Notes (Signed)
Pt states hx of arthritis to both knees and L ankle.  States recently pain and swelling have increased.

## 2018-01-09 ENCOUNTER — Other Ambulatory Visit: Payer: Self-pay

## 2018-02-28 DIAGNOSIS — M170 Bilateral primary osteoarthritis of knee: Secondary | ICD-10-CM | POA: Diagnosis not present

## 2018-02-28 DIAGNOSIS — M5137 Other intervertebral disc degeneration, lumbosacral region: Secondary | ICD-10-CM | POA: Diagnosis not present

## 2018-02-28 DIAGNOSIS — M25562 Pain in left knee: Secondary | ICD-10-CM | POA: Diagnosis not present

## 2018-02-28 DIAGNOSIS — M25561 Pain in right knee: Secondary | ICD-10-CM | POA: Diagnosis not present

## 2018-02-28 DIAGNOSIS — M25532 Pain in left wrist: Secondary | ICD-10-CM | POA: Diagnosis not present

## 2018-03-13 DIAGNOSIS — E785 Hyperlipidemia, unspecified: Secondary | ICD-10-CM | POA: Diagnosis not present

## 2018-03-13 DIAGNOSIS — E790 Hyperuricemia without signs of inflammatory arthritis and tophaceous disease: Secondary | ICD-10-CM | POA: Diagnosis not present

## 2018-03-13 DIAGNOSIS — R7309 Other abnormal glucose: Secondary | ICD-10-CM | POA: Diagnosis not present

## 2018-03-13 DIAGNOSIS — I10 Essential (primary) hypertension: Secondary | ICD-10-CM | POA: Diagnosis not present

## 2018-03-13 DIAGNOSIS — E7800 Pure hypercholesterolemia, unspecified: Secondary | ICD-10-CM | POA: Diagnosis not present

## 2018-03-15 DIAGNOSIS — M130 Polyarthritis, unspecified: Secondary | ICD-10-CM | POA: Diagnosis not present

## 2018-03-15 DIAGNOSIS — I10 Essential (primary) hypertension: Secondary | ICD-10-CM | POA: Diagnosis not present

## 2018-03-15 DIAGNOSIS — E785 Hyperlipidemia, unspecified: Secondary | ICD-10-CM | POA: Diagnosis not present

## 2018-04-12 DIAGNOSIS — H40053 Ocular hypertension, bilateral: Secondary | ICD-10-CM | POA: Diagnosis not present

## 2018-04-12 DIAGNOSIS — H25812 Combined forms of age-related cataract, left eye: Secondary | ICD-10-CM | POA: Diagnosis not present

## 2018-05-02 DIAGNOSIS — M5137 Other intervertebral disc degeneration, lumbosacral region: Secondary | ICD-10-CM | POA: Diagnosis not present

## 2018-05-02 DIAGNOSIS — M170 Bilateral primary osteoarthritis of knee: Secondary | ICD-10-CM | POA: Diagnosis not present

## 2018-05-02 DIAGNOSIS — M25531 Pain in right wrist: Secondary | ICD-10-CM | POA: Diagnosis not present

## 2018-05-02 DIAGNOSIS — M25532 Pain in left wrist: Secondary | ICD-10-CM | POA: Diagnosis not present

## 2018-07-28 DIAGNOSIS — R140 Abdominal distension (gaseous): Secondary | ICD-10-CM | POA: Diagnosis not present

## 2018-07-28 DIAGNOSIS — I10 Essential (primary) hypertension: Secondary | ICD-10-CM | POA: Diagnosis not present

## 2018-07-28 DIAGNOSIS — M130 Polyarthritis, unspecified: Secondary | ICD-10-CM | POA: Diagnosis not present

## 2018-07-28 DIAGNOSIS — E785 Hyperlipidemia, unspecified: Secondary | ICD-10-CM | POA: Diagnosis not present

## 2018-07-28 DIAGNOSIS — R7303 Prediabetes: Secondary | ICD-10-CM | POA: Diagnosis not present

## 2018-08-02 DIAGNOSIS — M130 Polyarthritis, unspecified: Secondary | ICD-10-CM | POA: Diagnosis not present

## 2018-08-02 DIAGNOSIS — I10 Essential (primary) hypertension: Secondary | ICD-10-CM | POA: Diagnosis not present

## 2018-08-02 DIAGNOSIS — E1100 Type 2 diabetes mellitus with hyperosmolarity without nonketotic hyperglycemic-hyperosmolar coma (NKHHC): Secondary | ICD-10-CM | POA: Diagnosis not present

## 2018-08-02 DIAGNOSIS — E785 Hyperlipidemia, unspecified: Secondary | ICD-10-CM | POA: Diagnosis not present

## 2018-08-16 DIAGNOSIS — M0600 Rheumatoid arthritis without rheumatoid factor, unspecified site: Secondary | ICD-10-CM | POA: Diagnosis not present

## 2018-08-16 DIAGNOSIS — M25562 Pain in left knee: Secondary | ICD-10-CM | POA: Diagnosis not present

## 2018-08-16 DIAGNOSIS — M170 Bilateral primary osteoarthritis of knee: Secondary | ICD-10-CM | POA: Diagnosis not present

## 2018-08-16 DIAGNOSIS — M5137 Other intervertebral disc degeneration, lumbosacral region: Secondary | ICD-10-CM | POA: Diagnosis not present

## 2018-08-16 DIAGNOSIS — M25561 Pain in right knee: Secondary | ICD-10-CM | POA: Diagnosis not present

## 2018-11-21 ENCOUNTER — Other Ambulatory Visit: Payer: Self-pay

## 2018-11-21 NOTE — Patient Outreach (Signed)
[facility] Care Management  11/21/2018  Amber Freeman 09/18/1932 [id number]   Medication Adherence call to Mrs. Amber Millinernna Velie left a message for patient to call back patient is due on Lisinopril/ HCTZ 20/25 mg. Mrs. Amber Freeman is showing past due under [facility].   Lillia AbedAna Ollison-Moran CPhT Pharmacy Technician [facility] Care Management Direct Dial [phone]  Fax [phone] [email or web address]

## 2018-12-29 DIAGNOSIS — I10 Essential (primary) hypertension: Secondary | ICD-10-CM | POA: Diagnosis not present

## 2018-12-29 DIAGNOSIS — E1100 Type 2 diabetes mellitus with hyperosmolarity without nonketotic hyperglycemic-hyperosmolar coma (NKHHC): Secondary | ICD-10-CM | POA: Diagnosis not present

## 2018-12-29 DIAGNOSIS — E785 Hyperlipidemia, unspecified: Secondary | ICD-10-CM | POA: Diagnosis not present

## 2019-01-03 DIAGNOSIS — I110 Hypertensive heart disease with heart failure: Secondary | ICD-10-CM | POA: Diagnosis not present

## 2019-01-03 DIAGNOSIS — M130 Polyarthritis, unspecified: Secondary | ICD-10-CM | POA: Diagnosis not present

## 2019-01-03 DIAGNOSIS — E7800 Pure hypercholesterolemia, unspecified: Secondary | ICD-10-CM | POA: Diagnosis not present

## 2019-02-16 DIAGNOSIS — M5137 Other intervertebral disc degeneration, lumbosacral region: Secondary | ICD-10-CM | POA: Diagnosis not present

## 2019-02-16 DIAGNOSIS — M25532 Pain in left wrist: Secondary | ICD-10-CM | POA: Diagnosis not present

## 2019-02-16 DIAGNOSIS — M0600 Rheumatoid arthritis without rheumatoid factor, unspecified site: Secondary | ICD-10-CM | POA: Diagnosis not present

## 2019-02-16 DIAGNOSIS — M25531 Pain in right wrist: Secondary | ICD-10-CM | POA: Diagnosis not present

## 2019-02-16 DIAGNOSIS — M170 Bilateral primary osteoarthritis of knee: Secondary | ICD-10-CM | POA: Diagnosis not present

## 2019-02-16 DIAGNOSIS — M25562 Pain in left knee: Secondary | ICD-10-CM | POA: Diagnosis not present

## 2019-02-16 DIAGNOSIS — M25561 Pain in right knee: Secondary | ICD-10-CM | POA: Diagnosis not present

## 2019-05-04 DIAGNOSIS — E790 Hyperuricemia without signs of inflammatory arthritis and tophaceous disease: Secondary | ICD-10-CM | POA: Diagnosis not present

## 2019-05-04 DIAGNOSIS — I10 Essential (primary) hypertension: Secondary | ICD-10-CM | POA: Diagnosis not present

## 2019-05-04 DIAGNOSIS — E7800 Pure hypercholesterolemia, unspecified: Secondary | ICD-10-CM | POA: Diagnosis not present

## 2019-05-04 DIAGNOSIS — E1100 Type 2 diabetes mellitus with hyperosmolarity without nonketotic hyperglycemic-hyperosmolar coma (NKHHC): Secondary | ICD-10-CM | POA: Diagnosis not present

## 2019-05-04 DIAGNOSIS — E785 Hyperlipidemia, unspecified: Secondary | ICD-10-CM | POA: Diagnosis not present

## 2019-05-08 DIAGNOSIS — M130 Polyarthritis, unspecified: Secondary | ICD-10-CM | POA: Diagnosis not present

## 2019-05-08 DIAGNOSIS — E785 Hyperlipidemia, unspecified: Secondary | ICD-10-CM | POA: Diagnosis not present

## 2019-05-08 DIAGNOSIS — Z0000 Encounter for general adult medical examination without abnormal findings: Secondary | ICD-10-CM | POA: Diagnosis not present

## 2019-05-08 DIAGNOSIS — I110 Hypertensive heart disease with heart failure: Secondary | ICD-10-CM | POA: Diagnosis not present

## 2019-08-06 DIAGNOSIS — M0600 Rheumatoid arthritis without rheumatoid factor, unspecified site: Secondary | ICD-10-CM | POA: Diagnosis not present

## 2019-08-06 DIAGNOSIS — M25561 Pain in right knee: Secondary | ICD-10-CM | POA: Diagnosis not present

## 2019-08-06 DIAGNOSIS — M25562 Pain in left knee: Secondary | ICD-10-CM | POA: Diagnosis not present

## 2019-08-06 DIAGNOSIS — M170 Bilateral primary osteoarthritis of knee: Secondary | ICD-10-CM | POA: Diagnosis not present

## 2019-08-06 DIAGNOSIS — M5137 Other intervertebral disc degeneration, lumbosacral region: Secondary | ICD-10-CM | POA: Diagnosis not present

## 2019-09-07 DIAGNOSIS — M130 Polyarthritis, unspecified: Secondary | ICD-10-CM | POA: Diagnosis not present

## 2019-09-07 DIAGNOSIS — E1100 Type 2 diabetes mellitus with hyperosmolarity without nonketotic hyperglycemic-hyperosmolar coma (NKHHC): Secondary | ICD-10-CM | POA: Diagnosis not present

## 2019-09-07 DIAGNOSIS — E785 Hyperlipidemia, unspecified: Secondary | ICD-10-CM | POA: Diagnosis not present

## 2019-09-07 DIAGNOSIS — I110 Hypertensive heart disease with heart failure: Secondary | ICD-10-CM | POA: Diagnosis not present

## 2019-09-13 DIAGNOSIS — I10 Essential (primary) hypertension: Secondary | ICD-10-CM | POA: Diagnosis not present

## 2019-09-13 DIAGNOSIS — E1169 Type 2 diabetes mellitus with other specified complication: Secondary | ICD-10-CM | POA: Diagnosis not present

## 2019-09-13 DIAGNOSIS — M130 Polyarthritis, unspecified: Secondary | ICD-10-CM | POA: Diagnosis not present

## 2019-10-31 ENCOUNTER — Other Ambulatory Visit: Payer: Self-pay

## 2019-10-31 NOTE — Patient Outreach (Signed)
[facility]) Care Management  10/31/2019  SARAN LAVIOLETTE June 19, 1932 [id number]   Medication Adherence call to Mrs. Margo Aye HIPPA Compliant Voice message left with a call back number. Mrs. Hughston is showing past due on Lisinopril/Hctz 20/25 mg under [place].   [place] Management Direct Dial [phone]  Fax [phone] [email or web address]

## 2020-01-11 DIAGNOSIS — M130 Polyarthritis, unspecified: Secondary | ICD-10-CM | POA: Diagnosis not present

## 2020-01-11 DIAGNOSIS — E1169 Type 2 diabetes mellitus with other specified complication: Secondary | ICD-10-CM | POA: Diagnosis not present

## 2020-01-11 DIAGNOSIS — E785 Hyperlipidemia, unspecified: Secondary | ICD-10-CM | POA: Diagnosis not present

## 2020-01-11 DIAGNOSIS — I10 Essential (primary) hypertension: Secondary | ICD-10-CM | POA: Diagnosis not present

## 2020-01-16 DIAGNOSIS — I10 Essential (primary) hypertension: Secondary | ICD-10-CM | POA: Diagnosis not present

## 2020-01-16 DIAGNOSIS — E1169 Type 2 diabetes mellitus with other specified complication: Secondary | ICD-10-CM | POA: Diagnosis not present

## 2020-01-16 DIAGNOSIS — E785 Hyperlipidemia, unspecified: Secondary | ICD-10-CM | POA: Diagnosis not present

## 2020-02-05 DIAGNOSIS — M0600 Rheumatoid arthritis without rheumatoid factor, unspecified site: Secondary | ICD-10-CM | POA: Diagnosis not present

## 2020-02-05 DIAGNOSIS — M25561 Pain in right knee: Secondary | ICD-10-CM | POA: Diagnosis not present

## 2020-02-05 DIAGNOSIS — M170 Bilateral primary osteoarthritis of knee: Secondary | ICD-10-CM | POA: Diagnosis not present

## 2020-02-05 DIAGNOSIS — M25562 Pain in left knee: Secondary | ICD-10-CM | POA: Diagnosis not present

## 2020-02-22 ENCOUNTER — Ambulatory Visit: Payer: Medicare Other | Attending: Internal Medicine

## 2020-02-22 DIAGNOSIS — Z23 Encounter for immunization: Secondary | ICD-10-CM | POA: Insufficient documentation

## 2020-02-22 NOTE — Progress Notes (Signed)
   Covid-19 Vaccination Clinic  Name:  PATRESE NEAL    MRN: [id number] DOB: [birth year]  02/22/2020  Ms. Chrisman was observed post Covid-19 immunization for 15 minutes without incidence. She was provided with Vaccine Information Sheet and instruction to access the V-Safe system.   Ms. Klee was instructed to call 911 with any severe reactions post vaccine: Marland Kitchen Difficulty breathing  . Swelling of your face and throat  . A fast heartbeat  . A bad rash all over your body  . Dizziness and weakness    Immunizations Administered    Name Date Dose VIS Date Route   Pfizer COVID-19 Vaccine 02/22/2020 10:18 AM 0.3 mL 12/07/2019 Intramuscular   Manufacturer: [employer], [employer]   Lot: [id number]   NDC: [address]-2

## 2020-02-26 DIAGNOSIS — I10 Essential (primary) hypertension: Secondary | ICD-10-CM | POA: Diagnosis not present

## 2020-02-26 DIAGNOSIS — E7800 Pure hypercholesterolemia, unspecified: Secondary | ICD-10-CM | POA: Diagnosis not present

## 2020-02-26 DIAGNOSIS — M170 Bilateral primary osteoarthritis of knee: Secondary | ICD-10-CM | POA: Diagnosis not present

## 2020-02-28 DIAGNOSIS — I10 Essential (primary) hypertension: Secondary | ICD-10-CM | POA: Diagnosis not present

## 2020-03-19 ENCOUNTER — Ambulatory Visit: Payer: Medicare Other | Attending: Internal Medicine

## 2020-03-19 DIAGNOSIS — Z23 Encounter for immunization: Secondary | ICD-10-CM

## 2020-03-19 NOTE — Progress Notes (Signed)
   Covid-19 Vaccination Clinic  Name:  Amber Freeman    MRN: [id number] DOB: Mar 15, 1932  03/19/2020  Amber Freeman was observed post Covid-19 immunization for 15 minutes without incident. She was provided with Vaccine Information Sheet and instruction to access the V-Safe system.   Amber Freeman was instructed to call 911 with any severe reactions post vaccine: Marland Kitchen Difficulty breathing  . Swelling of face and throat  . A fast heartbeat  . A bad rash all over body  . Dizziness and weakness   Immunizations Administered    Name Date Dose VIS Date Route   Pfizer COVID-19 Vaccine 03/19/2020  8:41 AM 0.3 mL 12/07/2019 Intramuscular   Manufacturer: [employer], [employer]   Lot: [id number]   NDC: [address]-3

## 2020-03-26 ENCOUNTER — Ambulatory Visit: Payer: Medicare Other | Attending: Family Medicine | Admitting: Physical Therapy

## 2020-03-26 ENCOUNTER — Encounter: Payer: Self-pay | Admitting: Physical Therapy

## 2020-03-26 ENCOUNTER — Other Ambulatory Visit: Payer: Self-pay

## 2020-03-26 DIAGNOSIS — M5442 Lumbago with sciatica, left side: Secondary | ICD-10-CM | POA: Diagnosis not present

## 2020-03-26 DIAGNOSIS — G8929 Other chronic pain: Secondary | ICD-10-CM

## 2020-03-26 DIAGNOSIS — R293 Abnormal posture: Secondary | ICD-10-CM | POA: Diagnosis not present

## 2020-03-26 NOTE — Patient Instructions (Signed)
Access Code: [email or web address]: [email or web address]: 03/31/2021Prepared by: Victorino Dike PaaExercises  Supine Lower Trunk Rotation - 2 x daily - 7 x weekly - 1 sets - 10 reps - 10 hold  Seated Hamstring Stretch - 2 x daily - 7 x weekly - 1 sets - 5 reps - 30 hold  Supine Single Knee to Chest Stretch - 2 x daily - 7 x weekly - 1 sets - 5 reps - 30 hold Seated trunk flexion leaning Rt to stretch L

## 2020-03-26 NOTE — Therapy (Signed)
[facility] [address] [place], [place], [address] Phone: [phone]   Fax:  [phone]  Physical Therapy Evaluation  Patient Details  Name: Amber Freeman MRN: [id number] Date of Birth: [birth year] Referring Provider (PT): Dr. Renaye Rakers    Encounter Date: 03/26/2020  PT End of Session - 03/26/20 1116    Visit Number  1    Number of Visits  16    Date for PT Re-Evaluation  05/21/20    Authorization Type  [employer] MCR    PT Start Time  0920    PT Stop Time  1010    PT Time Calculation (min)  50 min    Activity Tolerance  Patient tolerated treatment well    Behavior During Therapy  [facility] for tasks assessed/performed       Past Medical History:  Diagnosis Date  . Arthritis   . Hypercholesteremia   . Hypertension     History reviewed. No pertinent surgical history.  There were no vitals filed for this visit.   Subjective Assessment - 03/26/20 0925    Subjective  Patient presents for eval of low back pain in L side. She has pain with rolling in the bed.  She is limited in standing due to knee pain and also some back pain if she turns. She feels stiff in her back if she stands too long. She would like to see if she could get a lift chair. At one time she had some referred pain in L>R thigh but that seems to have eased off.  This has been ongoing for only 3 weeks.  Denies overt weakness but feels off balance at times.  She thinks LLE is shorter.    Pertinent History  bilateral knee OA    Limitations  Walking;Standing;Sitting;Lifting;House hold activities    How long can you sit comfortably?  sitting eases her back but then it hard to get up    How long can you stand comfortably?  1 hour    How long can you walk comfortably?  can walk in the grocery store with some tightness, not too bad    Diagnostic tests  none recent    Currently in Pain?  No/denies    Pain Score  --   stays moderate   Pain Location  Back    Pain  Orientation  Left;Lower    Aggravating Factors   standing , rolling, twisting    Pain Relieving Factors  tylenol, heating pad, sitting    Effect of Pain on Daily Activities  limits her mobility         [facility] PT Assessment - 03/26/20 0001      Assessment   Medical Diagnosis  Low back pain     Referring Provider (PT)  Dr. Renaye Rakers     Onset Date/Surgical Date  --   3 weeks    Next MD Visit  Unsure     Prior Therapy  No       Precautions   Precautions  None      Restrictions   Weight Bearing Restrictions  No      Balance Screen   Has the patient fallen in the past 6 months  No    Has the patient had a decrease in activity level because of a fear of falling?   Yes   lacks confidence once in awhile    Is the patient reluctant to leave their home because of a fear of falling?  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, exercise       Cognition   Overall Cognitive Status  Within Functional Limits for tasks assessed      Observation/Other Assessments   Focus on Therapeutic Outcomes (FOTO)   41%      Sensation   Light Touch  Not tested      Posture/Postural Control   Posture/Postural Control  Postural limitations    Postural Limitations  Rounded Shoulders;Forward head;Increased thoracic kyphosis;Posterior pelvic tilt;Left pelvic obliquity;Flexed trunk      AROM    Right Knee Extension  15    Left Knee Extension  12    Lumbar Flexion  65 deg     Lumbar Extension  only to neutral , ) deg ext tight and min pain     Lumbar - Right Side Bend  with decreased extension, stiff     Lumbar - Left Side Bend  same as R, tight on contralateral side     Lumbar - Right Rotation  50% limited     Lumbar - Left Rotation  50% limited       PROM   Overall PROM Comments  Rt hip pain at 90 deg Passively       Strength   Right Hip Flexion  4/5    Left Hip Flexion  4/5    Right Knee Flexion  5/5    Right Knee Extension  5/5    Left Knee Flexion  5/5    Left Knee Extension  5/5    Right Ankle Dorsiflexion  5/5    Left Ankle Dorsiflexion  5/5      Palpation   Palpation comment  L side paraspinals in L spine increased tone, spasm, extends ifrom L2 to L5       Special Tests   Other special tests  SLR neg with back tension at 50  deg       Transfers   Five time sit to stand comments   23.8 sec uses hands     Comments  can stand x 1 without UE but with knees collapsing inward, uses momentum       Ambulation/Gait   Ambulation Distance (Feet)  100 Feet    Assistive device  Straight cane   hurri-cane   Gait Pattern  Step-through pattern;Decreased stride length;Decreased dorsiflexion - right;Decreased dorsiflexion - left;Right flexed knee in stance;Left flexed knee in stance;Antalgic;Trunk flexed                Objective measurements completed on examination: See above findings.              PT Education - 03/26/20 1116    Education Details  PT/POC, HEP, sit to stand,  eval findings    Person(s) Educated  Patient    Methods  Explanation;Demonstration    Comprehension  Verbalized understanding;Returned demonstration       PT Short Term Goals - 03/26/20 1117      PT SHORT TERM GOAL #1   Title  Pt will be I with HEP for basic trunk flexibility and strength    Time  4    Period  Weeks    Status  New    Target Date  04/23/20      PT  SHORT TERM GOAL #2   Title  Pt will understand FOTO results and expectations for goals, level of function upon discharge    Time  4    Period  Weeks    Status  New    Target Date  04/23/20        PT Long Term Goals - 03/26/20 1119      PT LONG TERM GOAL #1   Title  Pt will be able to roll over in bed with min discomfort in L side of back, most of the time.    Time  8    Period  Weeks    Status  New    Target Date  05/21/20      PT LONG TERM GOAL #2   Title  Pt will be able to stand, walk in her community with no increase in back pain from baseline.    Time  8    Period  Weeks    Status  New    Target Date  05/21/20      PT LONG TERM GOAL #3   Title  Pt will be able to walk without the cane in her home to show independence    Time  8    Period  Weeks    Status  New    Target Date  05/21/20      PT LONG TERM GOAL #4   Title  Pt will sit to stand in 20 sec or less using UE for light support.    Baseline  26    Time  8    Period  Weeks    Status  New    Target Date  05/21/20      PT LONG TERM GOAL #5   Title  Pt will be able to extend her spine beyond neutral at least 20 deg with no increase in pain    Time  8    Period  Weeks    Status  New    Target Date  05/21/20             Plan - 03/26/20 1122    Clinical Impression Statement  Pt presents with low complexity eval of L sided low back pain which began only 3 weeks ago.  It seems to be related to a muscular strain on L side.  She lacks spinal ROM limited mostly in extension.  Hamstrings are very tight and she walks in a flexed position.She shows no weakness or worrisome signs.  She should do well with general mobility exercises to improve posture, flexibility and develop core strength.    Personal Factors and Comorbidities  Age;Comorbidity 2    Comorbidities  HTN, knee OA    Examination-Activity Limitations  Stairs;Stand;Bed Mobility;Bend;Lift;Sleep;Transfers;Squat;Locomotion Level     Examination-Participation Restrictions  Cleaning;Laundry;Interpersonal Relationship;Community Activity;Shop    Stability/Clinical Decision Making  Stable/Uncomplicated    Clinical Decision Making  Low    Rehab Potential  Excellent    PT Frequency  2x / week    PT Duration  8 weeks    PT Treatment/Interventions  ADLs/Self Care Home Management;Stair training;Therapeutic exercise;Passive range of motion;Electrical Stimulation;Cryotherapy;Moist Heat;Ultrasound;Functional mobility training;DME Instruction;Gait training;Therapeutic activities;Neuromuscular re-education;Patient/family education;Manual techniques   balance screen   PT Next Visit Plan  balance screen, address body mechanics and lifting.  check HEP, manual Lside    PT Home Exercise Plan  LTR, seated hamstrings, knee to chest, seated modified childs pose    Consulted and Agree with Plan of Care  Patient       Patient will benefit from skilled therapeutic intervention in order to improve the following deficits and impairments:  Abnormal gait, Decreased balance, Decreased mobility, Difficulty walking, Increased muscle spasms, Impaired flexibility, Postural dysfunction, Pain, Improper body mechanics, Increased fascial restricitons, Decreased range of motion  Visit Diagnosis: Chronic left-sided low back pain with left-sided sciatica  Abnormal posture     Problem List There are no problems to display for this patient.   Amber Freeman 03/26/2020, 1:39 PM  [facility] [address] [place], [place], [address] Phone: [phone]   Fax:  [phone]  Name: Amber Freeman MRN: [id number] Date of Birth: November 22, 1932   Karie Mainland, PT 03/26/20 1:39 PM Phone: [phone] Fax: [phone]

## 2020-04-09 ENCOUNTER — Other Ambulatory Visit: Payer: Self-pay

## 2020-04-09 ENCOUNTER — Ambulatory Visit: Payer: Medicare Other | Attending: Family Medicine

## 2020-04-09 DIAGNOSIS — M5442 Lumbago with sciatica, left side: Secondary | ICD-10-CM | POA: Diagnosis not present

## 2020-04-09 DIAGNOSIS — R293 Abnormal posture: Secondary | ICD-10-CM | POA: Diagnosis not present

## 2020-04-09 DIAGNOSIS — G8929 Other chronic pain: Secondary | ICD-10-CM | POA: Diagnosis not present

## 2020-04-09 NOTE — Therapy (Signed)
[place], [place], [address] Phone: [phone]   Fax:  [phone]  Physical Therapy Treatment  Patient Details  Name: Amber Freeman MRN: [id number] Date of Birth: Mar 26, 1932 Referring Provider (PT): Dr. Lucianne Lei    Encounter Date: 04/09/2020  PT End of Session - 04/09/20 1358    Visit Number  2    Number of Visits  16    Date for PT Re-Evaluation  05/21/20    PT Start Time  0913    PT Stop Time  1003    PT Time Calculation (min)  50 min    Activity Tolerance  Patient tolerated treatment well    Behavior During Therapy  [facility] for tasks assessed/performed       Past Medical History:  Diagnosis Date  . Arthritis   . Hypercholesteremia   . Hypertension     History reviewed. No pertinent surgical history.  There were no vitals filed for this visit.  Subjective Assessment - 04/09/20 0926    Subjective  Pt reports she is currently not experiencing back pain, but has stiffness. She states she has to be careful with turning. She reports she has been completing her HEP some.         [facility] PT Assessment - 04/09/20 0001      Balance   Balance Assessed  Yes      Standardized Balance Assessment   Standardized Balance Assessment  Berg Balance Test      Berg Balance Test   Sit to Stand  Able to stand using hands after several tries    Standing Unsupported  Able to stand 2 minutes with supervision    Sitting with Back Unsupported but Feet Supported on Floor or Stool  Able to sit safely and securely 2 minutes    Stand to Sit  Controls descent by using hands    Transfers  Able to transfer safely, definite need of hands    Standing Unsupported with Eyes Closed  Able to stand 10 seconds with supervision    Standing Unsupported with Feet Together  Able to place feet together independently and stand for 1 minute with supervision    From Standing, Reach Forward with Outstretched Arm  Can reach forward >12  cm safely (5")    From Standing Position, Pick up Object from Floor  Unable to pick up shoe, but reaches 2-5 cm (1-2") from shoe and balances independently    From Standing Position, Turn to Look Behind Over each Shoulder  Turn sideways only but maintains balance    Turn 360 Degrees  Able to turn 360 degrees safely but slowly    Standing Unsupported, Alternately Place Feet on Step/Stool  Able to complete >2 steps/needs minimal assist    Standing Unsupported, One Foot in Front  Able to take small step independently and hold 30 seconds    Standing on One Leg  Tries to lift leg/unable to hold 3 seconds but remains standing independently    Total Score  34                   [facility] Adult PT Treatment/Exercise - 04/09/20 0001      Exercises   Exercises  Lumbar;Knee/Hip      Lumbar Exercises: Stretches   Active Hamstring Stretch  Right;Left;3 reps;10 seconds    Active Hamstring Stretch Limitations  sitting    Other Lumbar Stretch Exercise  Child's pose in sitting c large ball 10x  each direction; forward and laterally    Other Lumbar Stretch Exercise  SKTC 10x, 3 sec      Lumbar Exercises: Supine   Pelvic Tilt  10 reps    Pelvic Tilt Limitations  3 sec    Bridge  10 reps;3 seconds             PT Education - 04/09/20 1356    Education Details  HEP and the purpose of strengthening and mobility exs. Recoomended use of SPC for balance assist c Berg score of 34/56.    Person(s) Educated  Patient    Methods  Explanation;Demonstration;Verbal cues;Tactile cues;Handout    Comprehension  Verbalized understanding;Returned demonstration;Verbal cues required;Tactile cues required;Need further instruction       PT Short Term Goals - 03/26/20 1117      PT SHORT TERM GOAL #1   Title  Pt will be I with HEP for basic trunk flexibility and strength    Time  4    Period  Weeks    Status  New    Target Date  04/23/20      PT SHORT TERM GOAL #2   Title  Pt will understand FOTO  results and expectations for goals, level of function upon discharge    Time  4    Period  Weeks    Status  New    Target Date  04/23/20        PT Long Term Goals - 03/26/20 1119      PT LONG TERM GOAL #1   Title  Pt will be able to roll over in bed with min discomfort in L side of back, most of the time.    Time  8    Period  Weeks    Status  New    Target Date  05/21/20      PT LONG TERM GOAL #2   Title  Pt will be able to stand, walk in her community with no increase in back pain from baseline.    Time  8    Period  Weeks    Status  New    Target Date  05/21/20      PT LONG TERM GOAL #3   Title  Pt will be able to walk without the cane in her home to show independence    Time  8    Period  Weeks    Status  New    Target Date  05/21/20      PT LONG TERM GOAL #4   Title  Pt will sit to stand in 20 sec or less using UE for light support.    Baseline  26    Time  8    Period  Weeks    Status  New    Target Date  05/21/20      PT LONG TERM GOAL #5   Title  Pt will be able to extend her spine beyond neutral at least 20 deg with no increase in pain    Time  8    Period  Weeks    Status  New    Target Date  05/21/20            Plan - 04/09/20 1359    Clinical Impression Statement  Breg revealed balance deficit with score of 34/56. Recommended use of the [facility] for balance assist. Continued with development of ther ex/HEP to address backnad LR mobility and strength. Pt partiipated well in PT returning  demonstration of ther ex.    PT Treatment/Interventions  ADLs/Self Care Home Management;Stair training;Therapeutic exercise;Passive range of motion;Electrical Stimulation;Cryotherapy;Moist Heat;Ultrasound;Functional mobility training;DME Instruction;Gait training;Therapeutic activities;Neuromuscular re-education;Patient/family education;Manual techniques    PT Next Visit Plan  Assess pt's response to HEP. Address body mechanics.    PT Home Exercise Plan  [id number],  seated hamstring stretch, knee to chest stretch, seated modified childs pose c table, PPT, bridging       Patient will benefit from skilled therapeutic intervention in order to improve the following deficits and impairments:  Abnormal gait, Decreased balance, Decreased mobility, Difficulty walking, Increased muscle spasms, Impaired flexibility, Postural dysfunction, Pain, Improper body mechanics, Increased fascial restricitons, Decreased range of motion  Visit Diagnosis: Chronic left-sided low back pain with left-sided sciatica  Abnormal posture     Problem List There are no problems to display for this patient.   Joellyn Rued MS, PT 04/09/20 2:12 PM  [facility] [address] [place], [place], [address] Phone: [phone]   Fax:  [phone]  Name: Amber Freeman MRN: [id number] Date of Birth: [birth year]

## 2020-04-10 DIAGNOSIS — I10 Essential (primary) hypertension: Secondary | ICD-10-CM | POA: Diagnosis not present

## 2020-04-10 DIAGNOSIS — Z0000 Encounter for general adult medical examination without abnormal findings: Secondary | ICD-10-CM | POA: Diagnosis not present

## 2020-04-14 ENCOUNTER — Ambulatory Visit: Payer: Medicare Other

## 2020-04-14 ENCOUNTER — Other Ambulatory Visit: Payer: Self-pay

## 2020-04-14 DIAGNOSIS — M5442 Lumbago with sciatica, left side: Secondary | ICD-10-CM | POA: Diagnosis not present

## 2020-04-14 DIAGNOSIS — R293 Abnormal posture: Secondary | ICD-10-CM | POA: Diagnosis not present

## 2020-04-14 DIAGNOSIS — G8929 Other chronic pain: Secondary | ICD-10-CM

## 2020-04-14 NOTE — Therapy (Signed)
[facility] Outpatient Rehabilitation [facility] [address] [place], [place], [address] Phone: [phone]   Fax:  [phone]  Physical Therapy Treatment  Patient Details  Name: Amber Freeman MRN: [id number] Date of Birth: [birth year] Referring Provider (PT): Dr. Renaye Rakers    Encounter Date: 04/14/2020  PT End of Session - 04/14/20 1328    Visit Number  3    Number of Visits  16    Date for PT Re-Evaluation  05/21/20    Authorization Type  [employer] MCR    PT Start Time  1003    PT Stop Time  1047    PT Time Calculation (min)  44 min    Activity Tolerance  Patient tolerated treatment well    Behavior During Therapy  [facility] for tasks assessed/performed       Past Medical History:  Diagnosis Date  . Arthritis   . Hypercholesteremia   . Hypertension     History reviewed. No pertinent surgical history.  There were no vitals filed for this visit.  Subjective Assessment - 04/14/20 1005    Subjective  Pt reports she is feeling better. She is mainly experiencing stiffness when she wakes up in the AM.    Currently in Pain?  No/denies    Pain Score  0-No pain    Aggravating Factors   Standing, rolling    Pain Relieving Factors  Tylenol, heating pad, exercise    Effect of Pain on Daily Activities  Decreased activity    Multiple Pain Sites  No                       [facility] Adult PT Treatment/Exercise - 04/14/20 0001      Ambulation/Gait   Assistive device  Straight cane   hurri-cane   Gait Pattern  Step-through pattern;Decreased stride length;Decreased dorsiflexion - right;Decreased dorsiflexion - left;Right flexed knee in stance;Left flexed knee in stance;Antalgic;Trunk flexed    Gait Comments  Improved pace      Exercises   Exercises  Lumbar;Knee/Hip      Lumbar Exercises: Stretches   Active Hamstring Stretch  Right;Left;2 reps;20 seconds    Active Hamstring Stretch Limitations  sitting    Lower Trunk Rotation  5 reps    Lower Trunk Rotation  Limitations  2 sets; 3 sec    Other Lumbar Stretch Exercise  Child's pose in sitting c large ball 5x each direction; forward and laterally    Other Lumbar Stretch Exercise  SKTC 10x, 3 sec      Lumbar Exercises: Standing   Scapular Retraction  Strengthening;Both;15 reps;Theraband    Theraband Level (Scapular Retraction)  Level 3 (Green)    Shoulder Extension  Strengthening;15 reps;Both;Theraband    Theraband Level (Shoulder Extension)  Level 3 (Green)      Lumbar Exercises: Seated   Other Seated Lumbar Exercises  STS 5x from armchair c VC for proper technique      Lumbar Exercises: Supine   Pelvic Tilt  10 reps    Pelvic Tilt Limitations  3 sec; c pelvic floor contraction    Bridge  10 reps;3 seconds             PT Education - 04/14/20 1327    Education Details  To complete LE/low back flexibility exs prior to getting out of bed to reduce stiffness.    Person(s) Educated  Patient    Methods  Explanation;Demonstration;Tactile cues;Verbal cues;Handout    Comprehension  Verbalized understanding;Returned demonstration;Verbal cues  required;Tactile cues required       PT Short Term Goals - 03/26/20 1117      PT SHORT TERM GOAL #1   Title  Pt will be I with HEP for basic trunk flexibility and strength    Time  4    Period  Weeks    Status  New    Target Date  04/23/20      PT SHORT TERM GOAL #2   Title  Pt will understand FOTO results and expectations for goals, level of function upon discharge    Time  4    Period  Weeks    Status  New    Target Date  04/23/20        PT Long Term Goals - 03/26/20 1119      PT LONG TERM GOAL #1   Title  Pt will be able to roll over in bed with min discomfort in L side of back, most of the time.    Time  8    Period  Weeks    Status  New    Target Date  05/21/20      PT LONG TERM GOAL #2   Title  Pt will be able to stand, walk in her community with no increase in back pain from baseline.    Time  8    Period  Weeks    Status   New    Target Date  05/21/20      PT LONG TERM GOAL #3   Title  Pt will be able to walk without the cane in her home to show independence    Time  8    Period  Weeks    Status  New    Target Date  05/21/20      PT LONG TERM GOAL #4   Title  Pt will sit to stand in 20 sec or less using UE for light support.    Baseline  26    Time  8    Period  Weeks    Status  New    Target Date  05/21/20      PT LONG TERM GOAL #5   Title  Pt will be able to extend her spine beyond neutral at least 20 deg with no increase in pain    Time  8    Period  Weeks    Status  New    Target Date  05/21/20            Plan - 04/14/20 1330    Clinical Impression Statement  Additional core strengthening exs were added to pt's HEP. Pt returned demonstration and tolerated exs well. Instructed in proper technique for STS from armchair, after which pt completed c greater ease, but min. labored.  With ambulation. pt's quality was improved with an increased pace.    PT Treatment/Interventions  ADLs/Self Care Home Management;Stair training;Therapeutic exercise;Passive range of motion;Electrical Stimulation;Cryotherapy;Moist Heat;Ultrasound;Functional mobility training;DME Instruction;Gait training;Therapeutic activities;Neuromuscular re-education;Patient/family education;Manual techniques    PT Next Visit Plan  Assess pt's STS from armchair and resonse to HEP.    PT Home Exercise Plan  [id number]: hip add set, hip abd c Tband, scapular retraction c TBand, and Shoulder extention c Tband were added.       Patient will benefit from skilled therapeutic intervention in order to improve the following deficits and impairments:  Abnormal gait, Decreased balance, Decreased mobility, Difficulty walking, Increased muscle spasms, Impaired flexibility, Postural dysfunction, Pain, Improper  body mechanics, Increased fascial restricitons, Decreased range of motion  Visit Diagnosis: Chronic left-sided low back pain with  left-sided sciatica  Abnormal posture     Problem List There are no problems to display for this patient.   Joellyn Rued MS, PT 04/14/20 1:43 PM   [facility] Outpatient Rehabilitation [facility] [address] [place], [place], [address] Phone: [phone]   Fax:  [phone]  Name: Amber Freeman MRN: [id number] Date of Birth: 1932/06/23

## 2020-04-15 ENCOUNTER — Ambulatory Visit: Payer: Medicare Other | Admitting: Physical Therapy

## 2020-04-15 DIAGNOSIS — R293 Abnormal posture: Secondary | ICD-10-CM

## 2020-04-15 DIAGNOSIS — G8929 Other chronic pain: Secondary | ICD-10-CM

## 2020-04-15 DIAGNOSIS — M5442 Lumbago with sciatica, left side: Secondary | ICD-10-CM | POA: Diagnosis not present

## 2020-04-15 NOTE — Therapy (Signed)
[place], [place], [address] Phone: [phone]   Fax:  [phone]  Physical Therapy Treatment  Patient Details  Name: Amber Freeman MRN: [id number] Date of Birth: [birth year] Referring Provider (PT): Dr. Lucianne Lei    Encounter Date: 04/15/2020  PT End of Session - 04/15/20 1611    Visit Number  4    Number of Visits  16    Date for PT Re-Evaluation  05/21/20    Authorization Type  UHC MCR    PT Start Time  4259    PT Stop Time  1620    PT Time Calculation (min)  45 min    Activity Tolerance  Patient tolerated treatment well    Behavior During Therapy  [facility] for tasks assessed/performed       Past Medical History:  Diagnosis Date  . Arthritis   . Hypercholesteremia   . Hypertension     No past surgical history on file.  There were no vitals filed for this visit.    [place] Adult PT Treatment/Exercise - 04/15/20 0001      Lumbar Exercises: Stretches   Single Knee to Chest Stretch  3 reps;30 seconds    Single Knee to Chest Stretch Limitations  add rotation     Lower Trunk Rotation  5 reps    Lower Trunk Rotation Limitations  10 sec       Lumbar Exercises: Aerobic   Nustep  L1 UE and LE for 6 min       Lumbar Exercises: Supine   Clam  20 reps    Clam Limitations  green band     Bridge  10 reps    Bridge Limitations  band on thighs for lateral hip activation       Knee/Hip Exercises: Seated   Sit to [employer]  2 sets;10 reps;with UE support;without UE support      Knee/Hip Exercises: Supine   Straight Leg Raises  Strengthening;Both;1 set;10 reps      Knee/Hip Exercises: Sidelying   Hip ABduction  Strengthening;Both;1 set;10 reps    Clams  x 15                PT Short Term Goals - 04/15/20 1615      PT SHORT TERM GOAL #1   Title  Pt will be I with HEP for basic trunk flexibility and strength    Status  Achieved        PT Long Term Goals - 04/15/20 1616      PT LONG  TERM GOAL #1   Title  Pt will be able to roll over in bed with min discomfort in L side of back, most of the time.    Status  On-going      PT LONG TERM GOAL #2   Title  Pt will be able to stand, walk in her community with no increase in back pain from baseline.    Baseline  AM increased pain, standing and walking    Status  On-going      PT LONG TERM GOAL #3   Title  Pt will be able to walk without the cane in her home to show independence    Status  Achieved      PT LONG TERM GOAL #4   Title  Pt will sit to stand in 20 sec or less using UE for light support.    Status  On-going  PT LONG TERM GOAL #5   Title  Pt will be able to extend her spine beyond neutral at least 20 deg with no increase in pain    Status  On-going            Plan - 04/15/20 1537    Clinical Impression Statement  Worked on functional strengthening today, Rt knee collapses inward but can control with band wrapped around thighs.  She had no back pain today. Rolling to her side is getting easier for her to do. She wants a lift chair but understands that she may be able to gain more strength prior to making that decision. Cont POC.    PT Treatment/Interventions  ADLs/Self Care Home Management;Stair training;Therapeutic exercise;Passive range of motion;Electrical Stimulation;Cryotherapy;Moist Heat;Ultrasound;Functional mobility training;DME Instruction;Gait training;Therapeutic activities;Neuromuscular re-education;Patient/family education;Manual techniques    PT Next Visit Plan  Assess pt's STS from armchair and resonse to HEP.    PT Home Exercise Plan  [id number]: hip add set, hip abd c Tband, scapular retraction c TBand, and Shoulder extention c Tband were added.    Consulted and Agree with Plan of Care  Patient       Patient will benefit from skilled therapeutic intervention in order to improve the following deficits and impairments:  Abnormal gait, Decreased balance, Decreased mobility, Difficulty walking,  Increased muscle spasms, Impaired flexibility, Postural dysfunction, Pain, Improper body mechanics, Increased fascial restricitons, Decreased range of motion  Visit Diagnosis: Chronic left-sided low back pain with left-sided sciatica  Abnormal posture     Problem List There are no problems to display for this patient.   Caroly Purewal 04/15/2020, 4:59 PM  [facility] [address] [place], [place], [address] Phone: [phone]   Fax:  [phone]  Name: Amber Freeman MRN: [id number] Date of Birth: 01-Feb-1932  Karie Mainland, PT 04/15/20 5:00 PM Phone: [phone] Fax: [phone]

## 2020-04-23 ENCOUNTER — Other Ambulatory Visit: Payer: Self-pay

## 2020-04-23 ENCOUNTER — Ambulatory Visit: Payer: Medicare Other

## 2020-04-23 DIAGNOSIS — R293 Abnormal posture: Secondary | ICD-10-CM

## 2020-04-23 DIAGNOSIS — G8929 Other chronic pain: Secondary | ICD-10-CM

## 2020-04-23 DIAGNOSIS — M5442 Lumbago with sciatica, left side: Secondary | ICD-10-CM

## 2020-04-23 NOTE — Therapy (Signed)
[place], [place], [address] Phone: [phone]   Fax:  [phone]  Physical Therapy Treatment  Patient Details  Name: Amber Freeman MRN: [id number] Date of Birth: 10/21/1932 Referring Provider (PT): Dr. Lucianne Lei    Encounter Date: 04/23/2020  PT End of Session - 04/23/20 1546    Visit Number  5    Number of Visits  16    Date for PT Re-Evaluation  05/21/20    Authorization Type  UHC MCR    PT Start Time  0921    PT Stop Time  1005    PT Time Calculation (min)  44 min    Activity Tolerance  Patient tolerated treatment well    Behavior During Therapy  [facility] for tasks assessed/performed       Past Medical History:  Diagnosis Date  . Arthritis   . Hypercholesteremia   . Hypertension     History reviewed. No pertinent surgical history.  There were no vitals filed for this visit.  Subjective Assessment - 04/23/20 0933    Subjective  Pt reports her R knee started bothering her over the weekend and it is still bothering her. She reports her R knee arthritis will fare up from time to time. It will last for a coufple to several days.    Currently in Pain?  Yes    Pain Score  5     Pain Location  Knee    Pain Orientation  Right    Pain Descriptors / Indicators  Aching    Pain Type  Chronic pain;Acute pain    Pain Onset  More than a month ago    Pain Frequency  Intermittent    Pain Relieving Factors  tylenol    Effect of Pain on Daily Activities  Decreases walking                       [facility] Adult PT Treatment/Exercise - 04/23/20 0001      Ambulation/Gait   Assistive device  Straight cane   hurri-cane   Gait Pattern  Step-through pattern;Antalgic   min antalgic gait pattern over the R LE.     Exercises   Exercises  Lumbar;Knee/Hip      Lumbar Exercises: Stretches   Active Hamstring Stretch  Right;Left;2 reps;20 seconds    Active Hamstring Stretch Limitations  sitting    Single Knee to Chest Stretch  2 reps;30 seconds    Single Knee to Chest Stretch Limitations  L LE only today due to R knee flareup    Lower Trunk Rotation  5 reps;3 reps    Lower Trunk Rotation Limitations  10 sec       Lumbar Exercises: Aerobic   Other Aerobic Exercise  Repetitive foot slides 3 mmins      Lumbar Exercises: Standing   Scapular Retraction  --    Theraband Level (Scapular Retraction)  --    Shoulder Extension  --    Theraband Level (Shoulder Extension)  --      Lumbar Exercises: Supine   Pelvic Tilt  10 reps    Pelvic Tilt Limitations  3 sec; c pelvic floor contraction    Clam  20 reps    Clam Limitations  green band       Knee/Hip Exercises: Supine   Other Supine Knee/Hip Exercises  Alterating knee lifts 10x each             PT  Education - 04/23/20 1546    Education Details  To limit WBing exs throught the R LE until begins to feel better.    Person(s) Educated  Patient    Methods  Explanation;Demonstration       PT Short Term Goals - 04/15/20 1615      PT SHORT TERM GOAL #1   Title  Pt will be I with HEP for basic trunk flexibility and strength    Status  Achieved        PT Long Term Goals - 04/15/20 1616      PT LONG TERM GOAL #1   Title  Pt will be able to roll over in bed with min discomfort in L side of back, most of the time.    Status  On-going      PT LONG TERM GOAL #2   Title  Pt will be able to stand, walk in her community with no increase in back pain from baseline.    Baseline  AM increased pain, standing and walking    Status  On-going      PT LONG TERM GOAL #3   Title  Pt will be able to walk without the cane in her home to show independence    Status  Achieved      PT LONG TERM GOAL #4   Title  Pt will sit to stand in 20 sec or less using UE for light support.    Status  On-going      PT LONG TERM GOAL #5   Title  Pt will be able to extend her spine beyond neutral at least 20 deg with no increase in pain    Status   On-going            Plan - 04/23/20 1549    Clinical Impression Statement  Ther ex and activity level was managed today due to a flare up of R knee pain. Pt participated well with ther ex completed today. With gait, pace was decreased and and a min antlgic apttern over the R LE was observed.    PT Treatment/Interventions  ADLs/Self Care Home Management;Stair training;Therapeutic exercise;Passive range of motion;Electrical Stimulation;Cryotherapy;Moist Heat;Ultrasound;Functional mobility training;DME Instruction;Gait training;Therapeutic activities;Neuromuscular re-education;Patient/family education;Manual techniques    PT Next Visit Plan  Complete full program for core and LE flexibility and strengthening as indicated depending on the status of her R knee.    PT Home Exercise Plan  No new exs added       Patient will benefit from skilled therapeutic intervention in order to improve the following deficits and impairments:  Abnormal gait, Decreased balance, Decreased mobility, Difficulty walking, Increased muscle spasms, Impaired flexibility, Postural dysfunction, Pain, Improper body mechanics, Increased fascial restricitons, Decreased range of motion  Visit Diagnosis: Chronic left-sided low back pain with left-sided sciatica  Abnormal posture     Problem List There are no problems to display for this patient.   Joellyn Rued MS, PT 04/23/20 3:59 PM  [facility] Outpatient Rehabilitation [facility] [address] [place], [place], [address] Phone: [phone]   Fax:  [phone]  Name: Amber Freeman MRN: [id number] Date of Birth: [birth year]

## 2020-04-24 ENCOUNTER — Ambulatory Visit: Payer: Medicare Other

## 2020-04-25 DIAGNOSIS — E7800 Pure hypercholesterolemia, unspecified: Secondary | ICD-10-CM | POA: Diagnosis not present

## 2020-04-25 DIAGNOSIS — M170 Bilateral primary osteoarthritis of knee: Secondary | ICD-10-CM | POA: Diagnosis not present

## 2020-04-25 DIAGNOSIS — I10 Essential (primary) hypertension: Secondary | ICD-10-CM | POA: Diagnosis not present

## 2020-04-28 ENCOUNTER — Ambulatory Visit: Payer: Medicare Other | Attending: Family Medicine | Admitting: Physical Therapy

## 2020-04-28 ENCOUNTER — Encounter: Payer: Self-pay | Admitting: Physical Therapy

## 2020-04-28 ENCOUNTER — Other Ambulatory Visit: Payer: Self-pay

## 2020-04-28 DIAGNOSIS — G8929 Other chronic pain: Secondary | ICD-10-CM | POA: Diagnosis not present

## 2020-04-28 DIAGNOSIS — R293 Abnormal posture: Secondary | ICD-10-CM | POA: Diagnosis not present

## 2020-04-28 DIAGNOSIS — M5442 Lumbago with sciatica, left side: Secondary | ICD-10-CM | POA: Diagnosis not present

## 2020-04-28 NOTE — Therapy (Signed)
[place], [place], [address] Phone: [phone]   Fax:  [phone]  Physical Therapy Treatment  Patient Details  Name: Amber Freeman MRN: [id number] Date of Birth: 1932-01-26 Referring Provider (PT): Dr. Lucianne Lei    Encounter Date: 04/28/2020  PT End of Session - 04/28/20 1004    Visit Number  6    Number of Visits  16    Date for PT Re-Evaluation  05/21/20    Authorization Type  [employer] MCR    PT Start Time  1001    PT Stop Time  1044    PT Time Calculation (min)  43 min    Activity Tolerance  Patient tolerated treatment well    Behavior During Therapy  [facility] for tasks assessed/performed       Past Medical History:  Diagnosis Date  . Arthritis   . Hypercholesteremia   . Hypertension     History reviewed. No pertinent surgical history.  There were no vitals filed for this visit.  Subjective Assessment - 04/28/20 1046    Subjective  Knee is better, Back does not hurt today.    Currently in Pain?  No/denies        [facility] Adult PT Treatment/Exercise - 04/28/20 0001      Self-Care   Self-Care  Posture;Other Self-Care Comments    Posture  wall slides, back extension     Other Self-Care Comments   HEP, POC, goals       Lumbar Exercises: Stretches   Active Hamstring Stretch  Right;Left;2 reps;30 seconds    Active Hamstring Stretch Limitations  supine strap     Single Knee to Chest Stretch  2 reps;30 seconds    Lower Trunk Rotation  5 reps;10 seconds    Lower Trunk Rotation Limitations  10 sec       Lumbar Exercises: Aerobic   Nustep  5 min L2 UE and LE       Lumbar Exercises: Standing   Wall Slides Limitations  facing wall x 5 each for safe extension       Knee/Hip Exercises: Sidelying   Hip ABduction  Strengthening;Both;1 set;15 reps    Clams  x 15                PT Short Term Goals - 04/15/20 1615      PT SHORT TERM GOAL #1   Title  Pt will be I with HEP for basic trunk  flexibility and strength    Status  Achieved        PT Long Term Goals - 04/28/20 1005      PT LONG TERM GOAL #1   Title  Pt will be able to roll over in bed with min discomfort in L side of back, most of the time.    Status  Achieved      PT LONG TERM GOAL #2   Title  Pt will be able to stand, walk in her community with no increase in back pain from baseline.    Baseline  can stand and walk for 30 min max and is ready to sit down , feels like she can do short errands are OK    Status  Partially Met      PT LONG TERM GOAL #3   Title  Pt will be able to walk without the cane in her home to show independence    Status  Achieved      PT  LONG TERM GOAL #4   Title  Pt will sit to stand in 20 sec or less using UE for light support.    Status  Partially Met      PT LONG TERM GOAL #5   Title  Pt will be able to extend her spine beyond neutral at least 20 deg with no increase in pain    Status  Achieved            Plan - 04/28/20 1027    Clinical Impression Statement  Patient has met several goals.  She needed foam pad to elevate hips for sit to stand due to hip weakness, cues for hip hinge. She feels comfortable with her current level of function. She feels ready for DC after her next appt.  Has a recumbant bike and knows her HEP, beginning to walk in her neightborhood a bit more.    PT Treatment/Interventions  ADLs/Self Care Home Management;Stair training;Therapeutic exercise;Passive range of motion;Electrical Stimulation;Cryotherapy;Moist Heat;Ultrasound;Functional mobility training;DME Instruction;Gait training;Therapeutic activities;Neuromuscular re-education;Patient/family education;Manual techniques    PT Next Visit Plan  DC, FOTO? Complete full program for core and LE flexibility and strengthening as indicated depending on the status of her R knee.    PT Home Exercise Plan  No new exs added    Consulted and Agree with Plan of Care  Patient       Patient will benefit from  skilled therapeutic intervention in order to improve the following deficits and impairments:  Abnormal gait, Decreased balance, Decreased mobility, Difficulty walking, Increased muscle spasms, Impaired flexibility, Postural dysfunction, Pain, Improper body mechanics, Increased fascial restricitons, Decreased range of motion  Visit Diagnosis: Chronic left-sided low back pain with left-sided sciatica  Abnormal posture     Problem List There are no problems to display for this patient.   PAA,JENNIFER 04/28/2020, 10:47 AM  [facility] [address] [place], [place], [address] Phone: [phone]   Fax:  [phone]  Name: Amber Freeman MRN: [id number] Date of Birth: 1932-09-13  Raeford Razor, PT 04/28/20 10:47 AM Phone: [phone] Fax: [phone]

## 2020-05-02 ENCOUNTER — Ambulatory Visit: Payer: Medicare Other | Admitting: Physical Therapy

## 2020-05-02 ENCOUNTER — Other Ambulatory Visit: Payer: Self-pay

## 2020-05-02 DIAGNOSIS — R293 Abnormal posture: Secondary | ICD-10-CM

## 2020-05-02 DIAGNOSIS — M5442 Lumbago with sciatica, left side: Secondary | ICD-10-CM | POA: Diagnosis not present

## 2020-05-02 DIAGNOSIS — G8929 Other chronic pain: Secondary | ICD-10-CM | POA: Diagnosis not present

## 2020-05-02 NOTE — Therapy (Signed)
[place], [place], [address] Phone: [phone]   Fax:  [phone]  Physical Therapy Treatment  Patient Details  Name: Amber Freeman MRN: [id number] Date of Birth: Feb 04, 1932 Referring Provider (PT): Dr. Lucianne Lei    Encounter Date: 05/02/2020  PT End of Session - 05/02/20 1011    Visit Number  7    Number of Visits  16    Date for PT Re-Evaluation  05/21/20    Authorization Type  [employer] MCR    PT Start Time  1004    PT Stop Time  1045    PT Time Calculation (min)  41 min    Activity Tolerance  Patient tolerated treatment well    Behavior During Therapy  [facility] [place] [facility] for tasks assessed/performed       Past Medical History:  Diagnosis Date  . Arthritis   . Hypercholesteremia   . Hypertension     No past surgical history on file.  There were no vitals filed for this visit.      [facility] PT Assessment - 05/02/20 0001      AROM   Lumbar Flexion  WFL    Lumbar Extension  limited 80% by stiff      Strength   Right Hip Flexion  4+/5    Left Hip Flexion  4+/5    Right Knee Flexion  5/5    Right Knee Extension  5/5    Left Knee Flexion  5/5    Left Knee Extension  5/5          [facility] Adult PT Treatment/Exercise - 05/02/20 0001      Self-Care   Other Self-Care Comments   goals, Discharge, plan for exercise, progress, FOTO       Lumbar Exercises: Stretches   Active Hamstring Stretch  Right;Left;2 reps;30 seconds    Active Hamstring Stretch Limitations  seated    Single Knee to Chest Stretch  2 reps;30 seconds      Lumbar Exercises: Aerobic   Nustep  5 min L4 UE and LE       Lumbar Exercises: Standing   Row  Strengthening;15 reps    Theraband Level (Row)  Level 3 (Green)    Shoulder Extension  Strengthening;Both;15 reps    Theraband Level (Shoulder Extension)  Level 3 (Green)      Lumbar Exercises: Sidelying   Clam  15 reps      Knee/Hip Exercises: Supine   Bridges Limitations  with band  x 10                PT Short Term Goals - 05/02/20 1025      PT SHORT TERM GOAL #1   Title  Pt will be I with HEP for basic trunk flexibility and strength    Status  Achieved      PT SHORT TERM GOAL #2   Title  Pt will understand FOTO results and expectations for goals, level of function upon discharge    Status  Achieved        PT Long Term Goals - 05/02/20 1026      PT LONG TERM GOAL #1   Title  Pt will be able to roll over in bed with min discomfort in L side of back, most of the time.    Status  Achieved      PT LONG TERM GOAL #2   Title  Pt will be able to stand, walk in her  community with no increase in back pain from baseline.    Baseline  can stand and walk for 30 min max and is ready to sit down , feels like she can do short errands are OK    Status  Partially Met      PT LONG TERM GOAL #3   Title  Pt will be able to walk without the cane in her home to show independence    Status  Achieved      PT LONG TERM GOAL #4   Title  Pt will sit to stand in 20 sec or less using UE for light support.    Status  Achieved      PT LONG TERM GOAL #5   Title  Pt will be able to extend her spine beyond neutral at least 20 deg with no increase in pain    Status  Achieved            Plan - 05/02/20 1011    Clinical Impression Statement  She has met all of her STG and LTG.  She cont. to have min difficulty with long periods of standing, walking.  Standing from a low chair can be a challenge as well. She has a plan to maintain her gains with her recumbant bike, HEP and walking FOTO score did not improve but subjectively she can report much less pain than prior    PT Treatment/Interventions  ADLs/Self Care Home Management;Stair training;Therapeutic exercise;Passive range of motion;Electrical Stimulation;Cryotherapy;Moist Heat;Ultrasound;Functional mobility training;DME Instruction;Gait training;Therapeutic activities;Neuromuscular re-education;Patient/family  education;Manual techniques    PT Next Visit Plan  DC    PT Home Exercise Plan  seated HS, supine PPT, single KTC, bridge, seated child pose, LTR, LAQ, ball squeeze, clam with band, stand row and ext.       Patient will benefit from skilled therapeutic intervention in order to improve the following deficits and impairments:  Abnormal gait, Decreased balance, Decreased mobility, Difficulty walking, Increased muscle spasms, Impaired flexibility, Postural dysfunction, Pain, Improper body mechanics, Increased fascial restricitons, Decreased range of motion  Visit Diagnosis: Chronic left-sided low back pain with left-sided sciatica  Abnormal posture     Problem List There are no problems to display for this patient.   Shelonda Saxe 05/02/2020, 10:58 AM  [facility] [address] [place], [place], [address] Phone: [phone]   Fax:  [phone]  Name: Amber Freeman MRN: [id number] Date of Birth: 03/14/1932   PHYSICAL THERAPY DISCHARGE SUMMARY  Visits from Start of Care: 7  Current functional level related to goals / functional outcomes: See above    Remaining deficits: Back pain intermittently, knee pain , posture    Education / Equipment: HEP, hip hinge, body mechanics  Plan: Patient agrees to discharge.  Patient goals were met. Patient is being discharged due to meeting the stated rehab goals.  ?????    Raeford Razor, PT 05/02/20 10:59 AM Phone: [phone] Fax: [phone]

## 2020-05-26 DIAGNOSIS — I10 Essential (primary) hypertension: Secondary | ICD-10-CM | POA: Diagnosis not present

## 2020-05-26 DIAGNOSIS — E7800 Pure hypercholesterolemia, unspecified: Secondary | ICD-10-CM | POA: Diagnosis not present

## 2020-05-26 DIAGNOSIS — M170 Bilateral primary osteoarthritis of knee: Secondary | ICD-10-CM | POA: Diagnosis not present

## 2020-06-25 DIAGNOSIS — I10 Essential (primary) hypertension: Secondary | ICD-10-CM | POA: Diagnosis not present

## 2020-06-25 DIAGNOSIS — M170 Bilateral primary osteoarthritis of knee: Secondary | ICD-10-CM | POA: Diagnosis not present

## 2020-06-25 DIAGNOSIS — E7800 Pure hypercholesterolemia, unspecified: Secondary | ICD-10-CM | POA: Diagnosis not present

## 2020-07-02 DIAGNOSIS — H25812 Combined forms of age-related cataract, left eye: Secondary | ICD-10-CM | POA: Diagnosis not present

## 2020-07-25 DIAGNOSIS — I10 Essential (primary) hypertension: Secondary | ICD-10-CM | POA: Diagnosis not present

## 2020-07-25 DIAGNOSIS — M170 Bilateral primary osteoarthritis of knee: Secondary | ICD-10-CM | POA: Diagnosis not present

## 2020-07-25 DIAGNOSIS — E7800 Pure hypercholesterolemia, unspecified: Secondary | ICD-10-CM | POA: Diagnosis not present

## 2020-07-28 ENCOUNTER — Other Ambulatory Visit: Payer: Self-pay | Admitting: Family Medicine

## 2020-07-28 ENCOUNTER — Ambulatory Visit
Admission: RE | Admit: 2020-07-28 | Discharge: 2020-07-28 | Disposition: A | Discharge status: Home / Self Care | Payer: Medicare Other | Source: Ambulatory Visit | Attending: Family Medicine | Admitting: Family Medicine

## 2020-07-28 DIAGNOSIS — M545 Low back pain, unspecified: Secondary | ICD-10-CM

## 2020-07-28 DIAGNOSIS — M160 Bilateral primary osteoarthritis of hip: Secondary | ICD-10-CM | POA: Diagnosis not present

## 2020-07-28 DIAGNOSIS — M47816 Spondylosis without myelopathy or radiculopathy, lumbar region: Secondary | ICD-10-CM | POA: Diagnosis not present

## 2020-07-28 DIAGNOSIS — M533 Sacrococcygeal disorders, not elsewhere classified: Secondary | ICD-10-CM | POA: Diagnosis not present

## 2020-07-28 DIAGNOSIS — E1100 Type 2 diabetes mellitus with hyperosmolarity without nonketotic hyperglycemic-hyperosmolar coma (NKHHC): Secondary | ICD-10-CM | POA: Diagnosis not present

## 2020-07-28 DIAGNOSIS — M4186 Other forms of scoliosis, lumbar region: Secondary | ICD-10-CM | POA: Diagnosis not present

## 2020-07-28 DIAGNOSIS — M130 Polyarthritis, unspecified: Secondary | ICD-10-CM | POA: Diagnosis not present

## 2020-07-28 DIAGNOSIS — M47818 Spondylosis without myelopathy or radiculopathy, sacral and sacrococcygeal region: Secondary | ICD-10-CM | POA: Diagnosis not present

## 2020-07-28 DIAGNOSIS — I10 Essential (primary) hypertension: Secondary | ICD-10-CM | POA: Diagnosis not present

## 2020-08-05 DIAGNOSIS — M545 Low back pain: Secondary | ICD-10-CM | POA: Diagnosis not present

## 2020-08-22 DIAGNOSIS — M130 Polyarthritis, unspecified: Secondary | ICD-10-CM | POA: Diagnosis not present

## 2020-08-22 DIAGNOSIS — I10 Essential (primary) hypertension: Secondary | ICD-10-CM | POA: Diagnosis not present

## 2020-08-22 DIAGNOSIS — E1169 Type 2 diabetes mellitus with other specified complication: Secondary | ICD-10-CM | POA: Diagnosis not present

## 2020-08-26 DIAGNOSIS — I10 Essential (primary) hypertension: Secondary | ICD-10-CM | POA: Diagnosis not present

## 2020-08-26 DIAGNOSIS — E785 Hyperlipidemia, unspecified: Secondary | ICD-10-CM | POA: Diagnosis not present

## 2020-08-26 DIAGNOSIS — M170 Bilateral primary osteoarthritis of knee: Secondary | ICD-10-CM | POA: Diagnosis not present

## 2020-09-02 DIAGNOSIS — M25561 Pain in right knee: Secondary | ICD-10-CM | POA: Diagnosis not present

## 2020-09-02 DIAGNOSIS — M25562 Pain in left knee: Secondary | ICD-10-CM | POA: Diagnosis not present

## 2020-09-02 DIAGNOSIS — M0600 Rheumatoid arthritis without rheumatoid factor, unspecified site: Secondary | ICD-10-CM | POA: Diagnosis not present

## 2020-09-02 DIAGNOSIS — M5137 Other intervertebral disc degeneration, lumbosacral region: Secondary | ICD-10-CM | POA: Diagnosis not present

## 2020-09-02 DIAGNOSIS — M170 Bilateral primary osteoarthritis of knee: Secondary | ICD-10-CM | POA: Diagnosis not present

## 2020-09-16 DIAGNOSIS — H25812 Combined forms of age-related cataract, left eye: Secondary | ICD-10-CM | POA: Diagnosis not present

## 2020-09-19 ENCOUNTER — Other Ambulatory Visit: Payer: Self-pay | Admitting: Family Medicine

## 2020-09-19 ENCOUNTER — Ambulatory Visit
Admission: RE | Admit: 2020-09-19 | Discharge: 2020-09-19 | Disposition: A | Discharge status: Home / Self Care | Payer: Medicare Other | Source: Ambulatory Visit | Attending: Family Medicine | Admitting: Family Medicine

## 2020-09-19 DIAGNOSIS — M5137 Other intervertebral disc degeneration, lumbosacral region: Secondary | ICD-10-CM | POA: Diagnosis not present

## 2020-09-19 DIAGNOSIS — I10 Essential (primary) hypertension: Secondary | ICD-10-CM | POA: Diagnosis not present

## 2020-09-19 DIAGNOSIS — M0510 Rheumatoid lung disease with rheumatoid arthritis of unspecified site: Secondary | ICD-10-CM | POA: Diagnosis not present

## 2020-09-19 DIAGNOSIS — M130 Polyarthritis, unspecified: Secondary | ICD-10-CM

## 2020-09-19 DIAGNOSIS — M061 Adult-onset Still's disease: Secondary | ICD-10-CM | POA: Diagnosis not present

## 2020-09-19 DIAGNOSIS — M47816 Spondylosis without myelopathy or radiculopathy, lumbar region: Secondary | ICD-10-CM | POA: Diagnosis not present

## 2020-09-25 DIAGNOSIS — E785 Hyperlipidemia, unspecified: Secondary | ICD-10-CM | POA: Diagnosis not present

## 2020-09-25 DIAGNOSIS — M170 Bilateral primary osteoarthritis of knee: Secondary | ICD-10-CM | POA: Diagnosis not present

## 2020-09-25 DIAGNOSIS — I10 Essential (primary) hypertension: Secondary | ICD-10-CM | POA: Diagnosis not present

## 2020-09-29 DIAGNOSIS — M130 Polyarthritis, unspecified: Secondary | ICD-10-CM | POA: Diagnosis not present

## 2020-10-09 DIAGNOSIS — M545 Low back pain, unspecified: Secondary | ICD-10-CM | POA: Diagnosis not present

## 2020-10-09 DIAGNOSIS — M4316 Spondylolisthesis, lumbar region: Secondary | ICD-10-CM | POA: Diagnosis not present

## 2020-10-09 DIAGNOSIS — M1612 Unilateral primary osteoarthritis, left hip: Secondary | ICD-10-CM | POA: Diagnosis not present

## 2020-10-20 DIAGNOSIS — M1612 Unilateral primary osteoarthritis, left hip: Secondary | ICD-10-CM | POA: Diagnosis not present

## 2020-10-25 DIAGNOSIS — M170 Bilateral primary osteoarthritis of knee: Secondary | ICD-10-CM | POA: Diagnosis not present

## 2020-10-25 DIAGNOSIS — E785 Hyperlipidemia, unspecified: Secondary | ICD-10-CM | POA: Diagnosis not present

## 2020-10-25 DIAGNOSIS — I10 Essential (primary) hypertension: Secondary | ICD-10-CM | POA: Diagnosis not present

## 2020-11-10 DIAGNOSIS — M4316 Spondylolisthesis, lumbar region: Secondary | ICD-10-CM | POA: Diagnosis not present

## 2020-11-10 DIAGNOSIS — M1612 Unilateral primary osteoarthritis, left hip: Secondary | ICD-10-CM | POA: Diagnosis not present

## 2020-11-12 DIAGNOSIS — T23171A Burn of first degree of right wrist, initial encounter: Secondary | ICD-10-CM | POA: Diagnosis not present

## 2020-11-12 DIAGNOSIS — Y93G3 Activity, cooking and baking: Secondary | ICD-10-CM | POA: Diagnosis not present

## 2020-11-24 DIAGNOSIS — M48061 Spinal stenosis, lumbar region without neurogenic claudication: Secondary | ICD-10-CM | POA: Diagnosis not present

## 2020-11-24 DIAGNOSIS — M47816 Spondylosis without myelopathy or radiculopathy, lumbar region: Secondary | ICD-10-CM | POA: Diagnosis not present

## 2020-11-24 DIAGNOSIS — M4316 Spondylolisthesis, lumbar region: Secondary | ICD-10-CM | POA: Diagnosis not present

## 2020-11-25 DIAGNOSIS — E785 Hyperlipidemia, unspecified: Secondary | ICD-10-CM | POA: Diagnosis not present

## 2020-11-25 DIAGNOSIS — I10 Essential (primary) hypertension: Secondary | ICD-10-CM | POA: Diagnosis not present

## 2020-11-25 DIAGNOSIS — M170 Bilateral primary osteoarthritis of knee: Secondary | ICD-10-CM | POA: Diagnosis not present

## 2020-12-08 DIAGNOSIS — I10 Essential (primary) hypertension: Secondary | ICD-10-CM | POA: Diagnosis not present

## 2020-12-08 DIAGNOSIS — Z79891 Long term (current) use of opiate analgesic: Secondary | ICD-10-CM | POA: Diagnosis not present

## 2020-12-08 DIAGNOSIS — G894 Chronic pain syndrome: Secondary | ICD-10-CM | POA: Diagnosis not present

## 2020-12-08 DIAGNOSIS — M4316 Spondylolisthesis, lumbar region: Secondary | ICD-10-CM | POA: Diagnosis not present

## 2020-12-08 DIAGNOSIS — M1612 Unilateral primary osteoarthritis, left hip: Secondary | ICD-10-CM | POA: Diagnosis not present

## 2020-12-08 DIAGNOSIS — Z4689 Encounter for fitting and adjustment of other specified devices: Secondary | ICD-10-CM | POA: Diagnosis not present

## 2020-12-17 DIAGNOSIS — I10 Essential (primary) hypertension: Secondary | ICD-10-CM | POA: Diagnosis not present

## 2020-12-17 DIAGNOSIS — E785 Hyperlipidemia, unspecified: Secondary | ICD-10-CM | POA: Diagnosis not present

## 2020-12-17 DIAGNOSIS — M130 Polyarthritis, unspecified: Secondary | ICD-10-CM | POA: Diagnosis not present

## 2020-12-17 DIAGNOSIS — Z7189 Other specified counseling: Secondary | ICD-10-CM | POA: Diagnosis not present

## 2020-12-17 DIAGNOSIS — M5459 Other low back pain: Secondary | ICD-10-CM | POA: Diagnosis not present

## 2020-12-22 DIAGNOSIS — M47816 Spondylosis without myelopathy or radiculopathy, lumbar region: Secondary | ICD-10-CM | POA: Diagnosis not present

## 2020-12-26 DIAGNOSIS — I10 Essential (primary) hypertension: Secondary | ICD-10-CM | POA: Diagnosis not present

## 2020-12-26 DIAGNOSIS — M170 Bilateral primary osteoarthritis of knee: Secondary | ICD-10-CM | POA: Diagnosis not present

## 2020-12-26 DIAGNOSIS — E785 Hyperlipidemia, unspecified: Secondary | ICD-10-CM | POA: Diagnosis not present

## 2021-03-22 IMAGING — DX DG SI JOINTS 3+V
3 series · 3 of 3 positions shown · non-contrast
Comparison: None.

CLINICAL DATA: Low back pain for 1 week.

EXAM:
BILATERAL SACROILIAC JOINTS - 3+ VIEW

[dg si joints (1 of 3)]
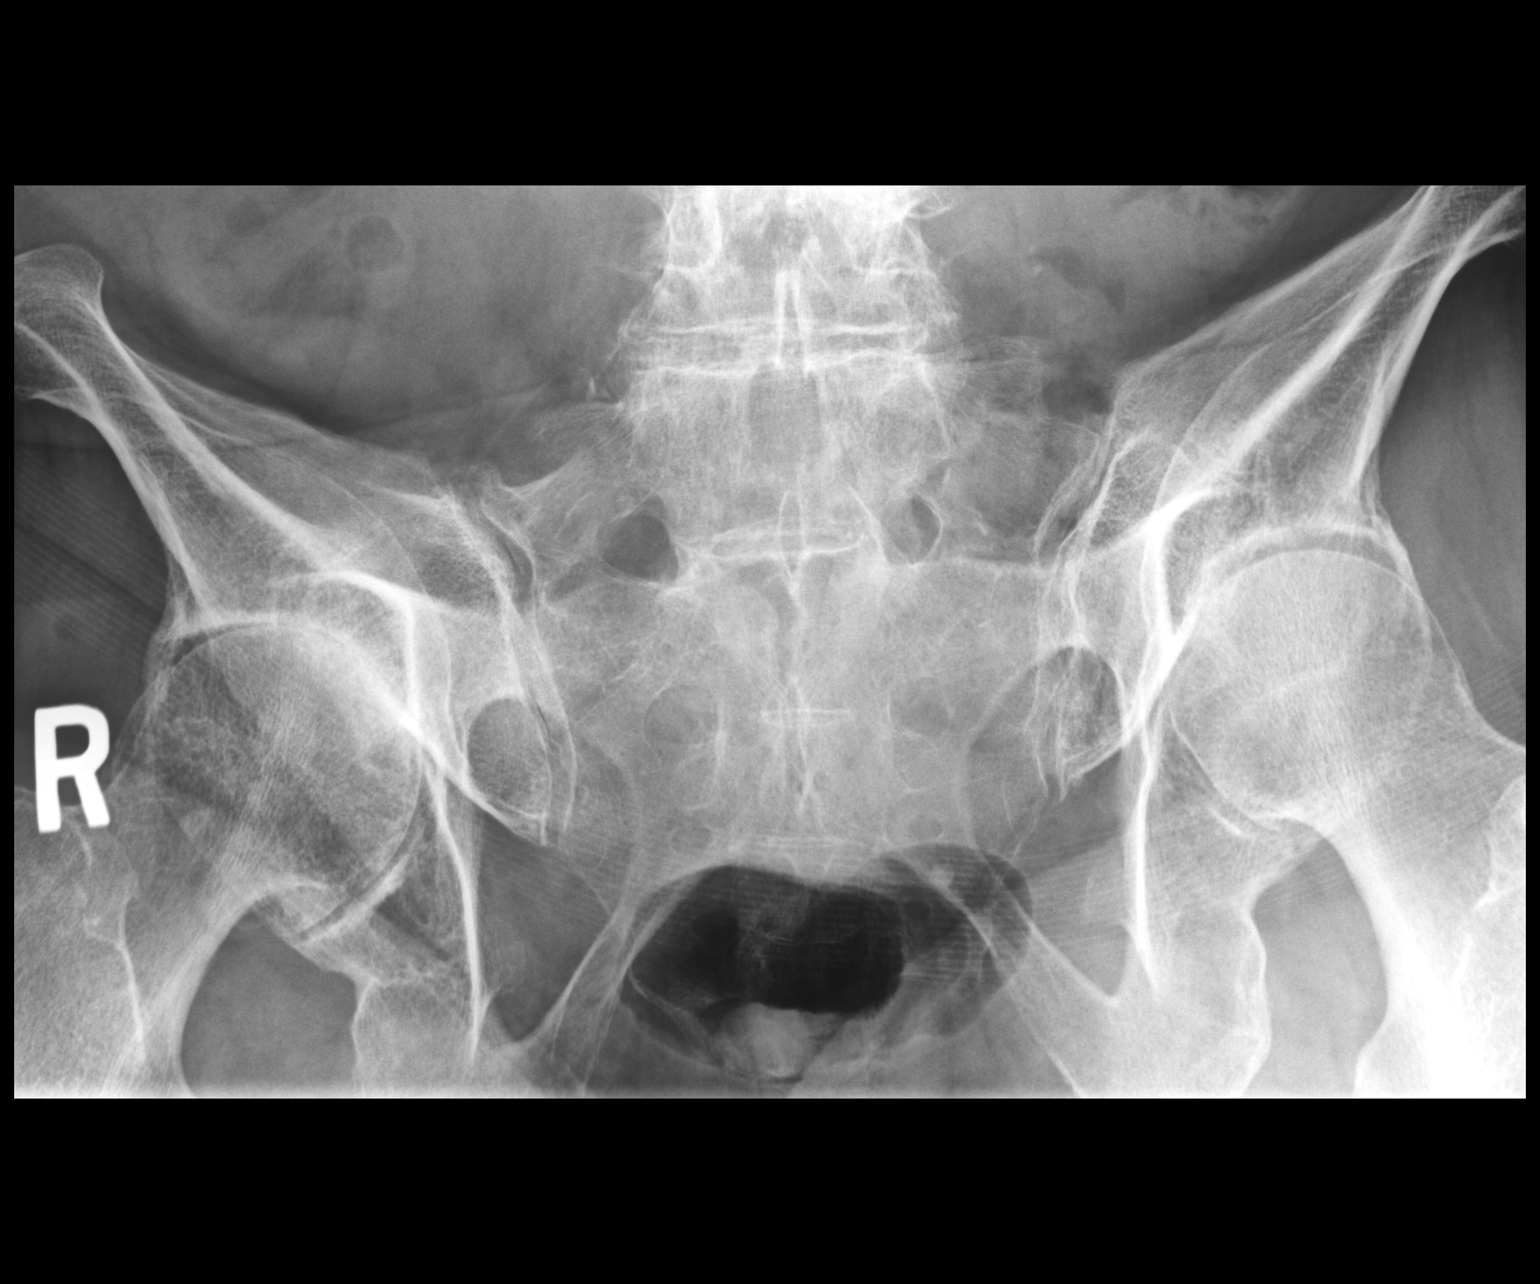

[dg si joints (2 of 3)]
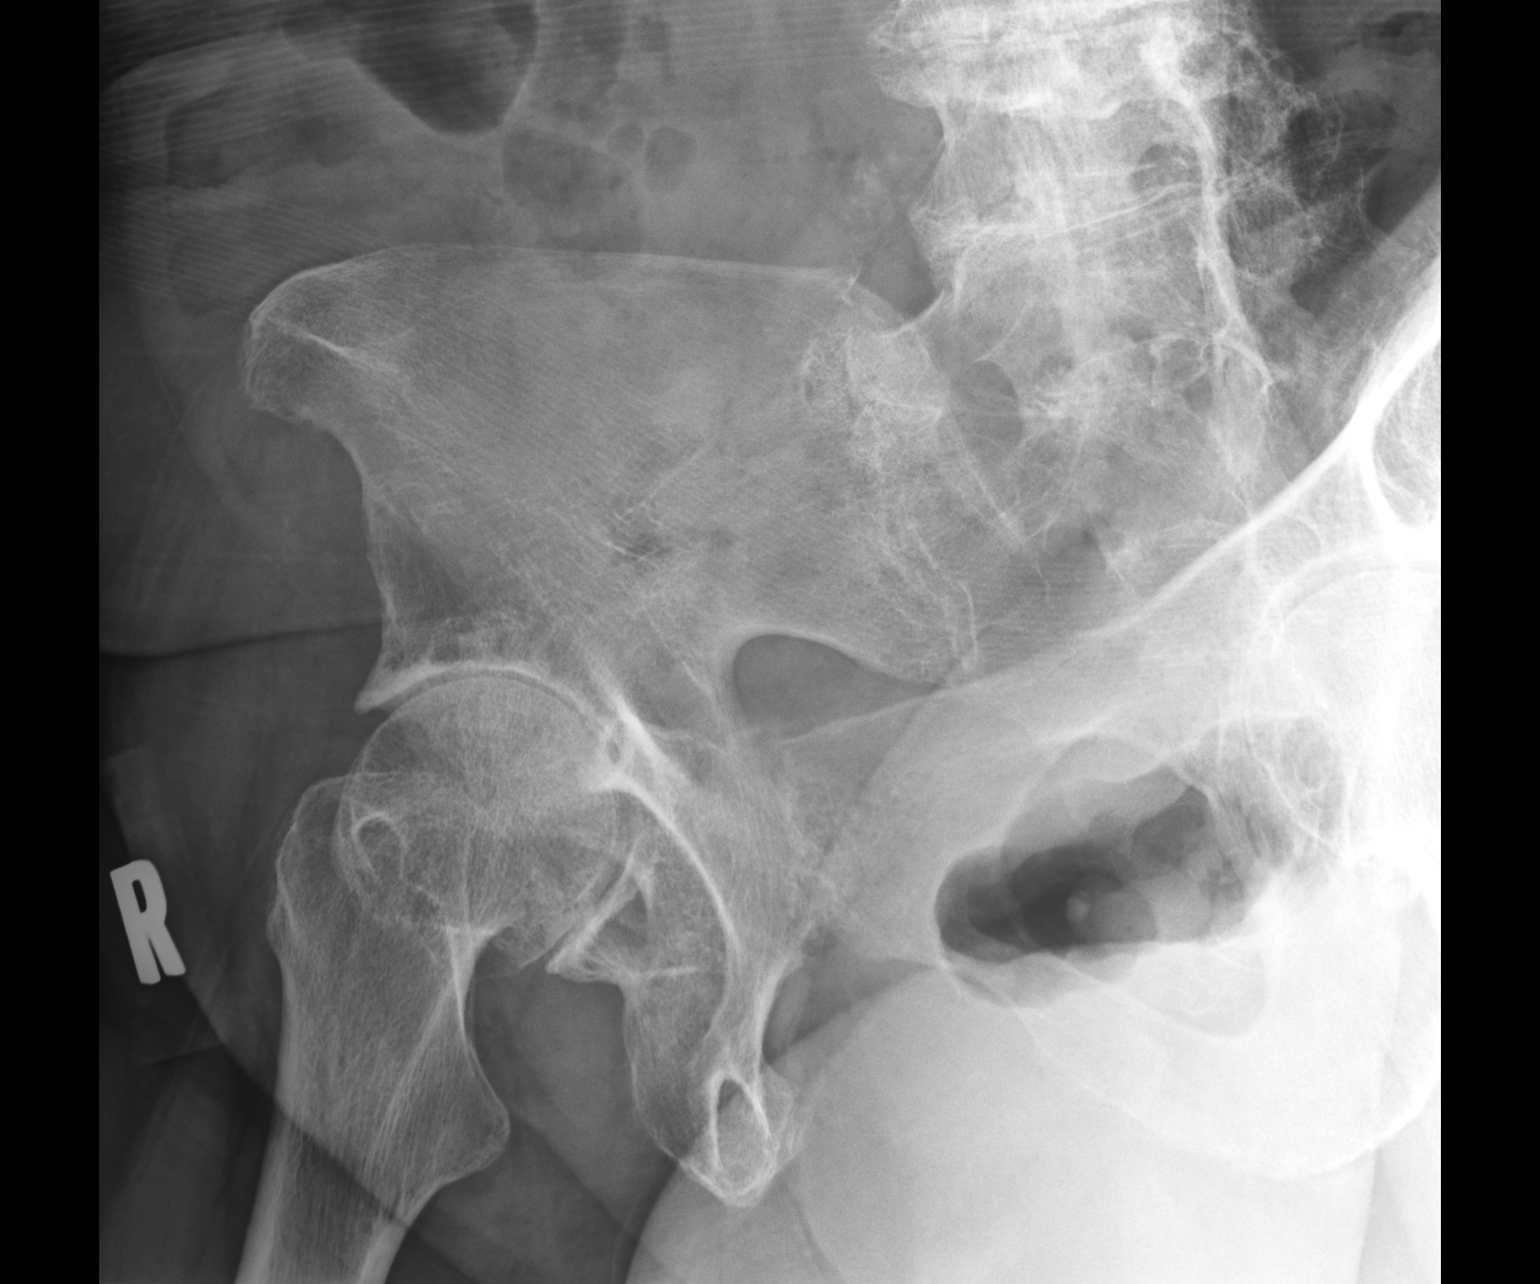

[dg si joints (3 of 3)]
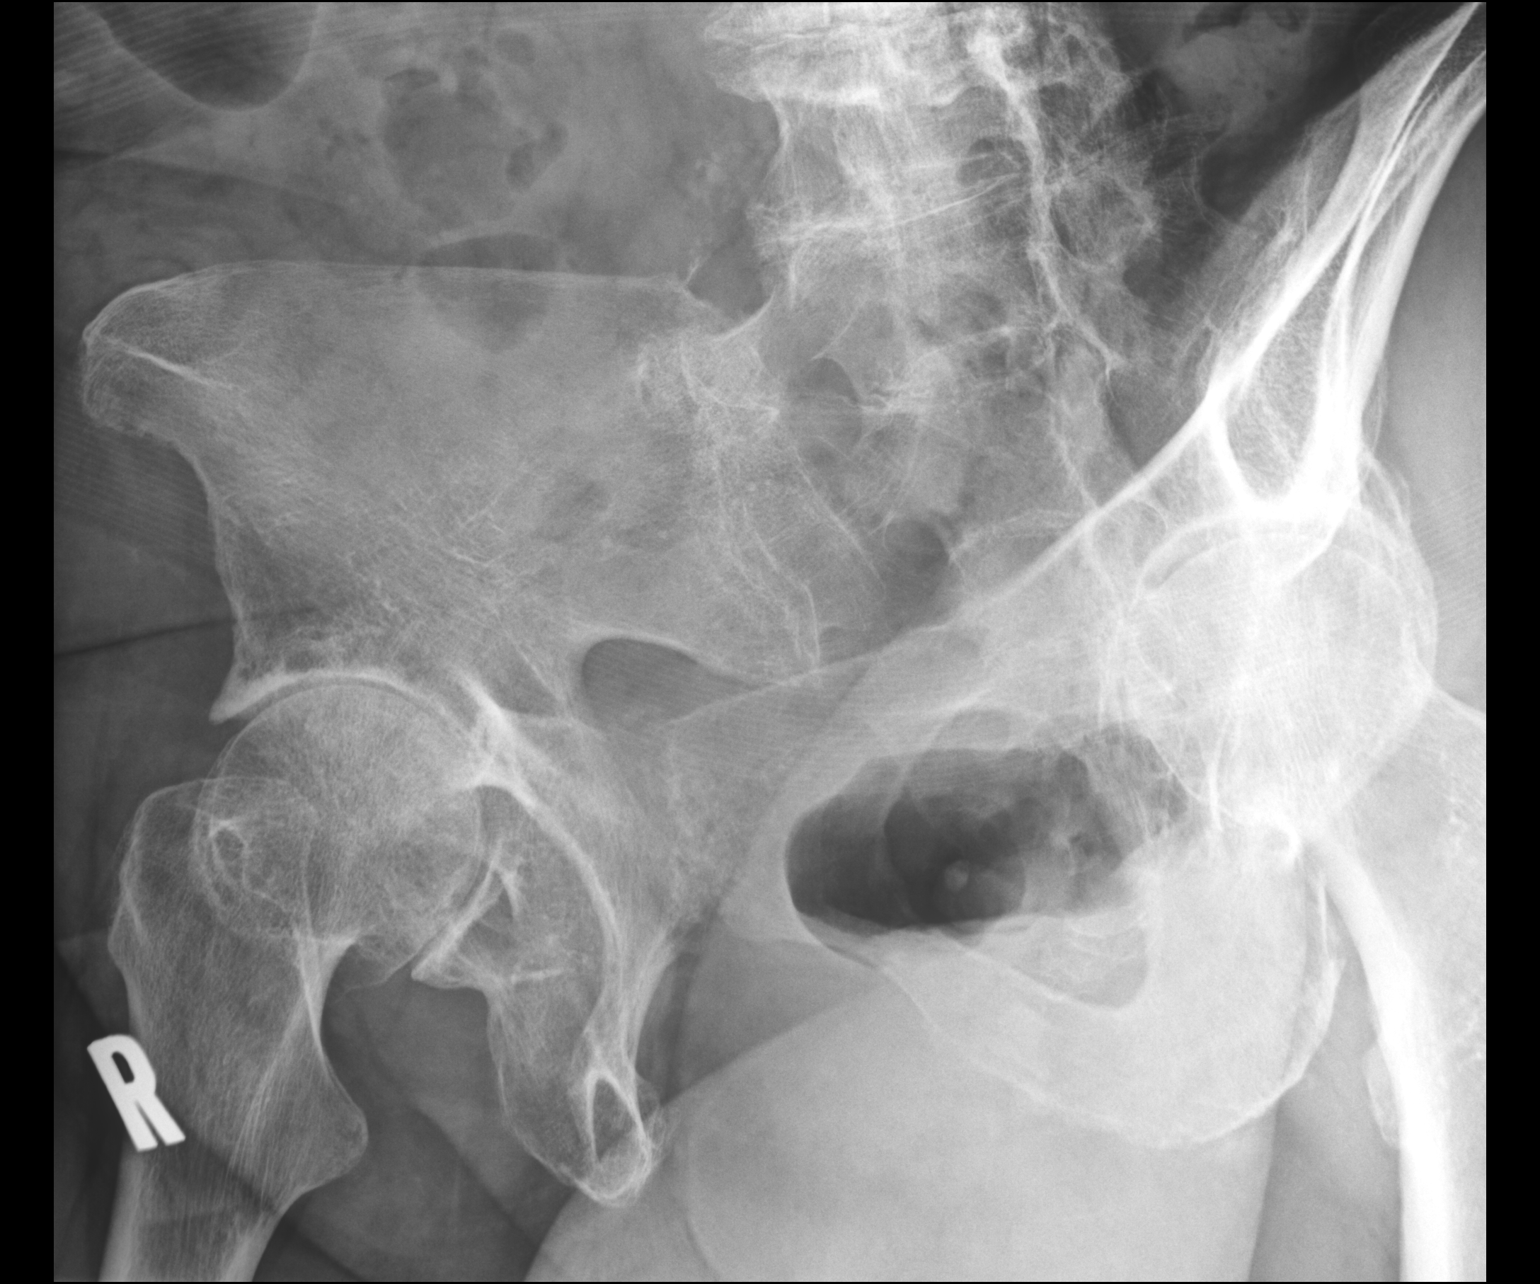

[3 of 3 positions shown; findings below may reference images not displayed]

FINDINGS: SI joints appear symmetrical and are patent. Degenerative changes
with mild sclerosis and hypertrophic change. No evidence of acute
fracture or displacement. No focal bone lesions identified.
Degenerative changes are also incidentally noted in the hips.
IMPRESSION: Degenerative changes in the sacroiliac joints.

## 2021-04-29 NOTE — Progress Notes (Addendum)
COVID Vaccine Completed:  x3 Date COVID Vaccine completed:  02-22-20, 03-19-20 Has received booster: 01-19-21 COVID vaccine manufacturer: Pf[employer]      Date of COVID positive in last 90 days:  N/A  PCP - Renaye Rakers, MD Cardiologist - N/A  Chest x-ray - N/A EKG - 05-01-21 Epic Stress Test - N/A ECHO - N/A  Cardiac Cath - N/A Pacemaker/ICD device last checked: Spinal Cord Stimulator:  Sleep Study - N/A CPAP -   Fasting Blood Sugar - N/A Checks Blood Sugar _____ times a day  Blood Thinner Instructions:  N/A Aspirin Instructions: Last Dose:  Activity level:  Can go up a flight of stairs and perform activities of daily living without stopping and without symptoms of chest pain or shortness of breath.    Anesthesia review:   PTT 41, Dr. Tedra Senegal notes on chart.  Patient denies shortness of breath, fever, cough and chest pain at PAT appointment   Patient verbalized understanding of instructions that were given to them at the PAT appointment. Patient was also instructed that they will need to review over the PAT instructions again at home before surgery.

## 2021-04-29 NOTE — Patient Instructions (Addendum)
DUE TO COVID-19 ONLY ONE VISITOR IS ALLOWED TO COME WITH YOU AND STAY IN THE WAITING ROOM ONLY DURING PRE OP AND PROCEDURE.   **NO VISITORS ARE ALLOWED IN THE SHORT STAY AREA OR RECOVERY ROOM!!**  IF YOU WILL BE ADMITTED INTO THE HOSPITAL YOU ARE ALLOWED ONLY TWO SUPPORT PEOPLE DURING VISITATION HOURS ONLY (10AM -8PM)   . The support person(s) may change daily. . The support person(s) must pass our screening, gel in and out, and wear a mask at all times, including in the patient's room. . Patients must also wear a mask when staff or their support person are in the room.  No visitors under the age of 53. Any visitor under the age of 86 must be accompanied by an adult.    COVID SWAB TESTING MUST BE COMPLETED ON:  Friday, 05-08-21 @ 2:50 PM   [address] [place], [place] [address]  (Must self quarantine after testing. Follow instructions on handout.)        Your procedure is scheduled on: Tuesday, 05-12-21   Report to [facility] Main  Entrance    Report to admitting at 7:30 AM   Call this number if you have problems the morning of surgery [phone]   Do not eat food :After Midnight.   May have liquids until 7:00 AM day of surgery  CLEAR LIQUID DIET  Foods Allowed                                                                     Foods Excluded  Water, Black Coffee and tea, regular and decaf             liquids that you cannot  Plain Jell-O in any flavor  (No red)                                    see through such as: Fruit ices (not with fruit pulp)                                      milk, soups, orange juice              Iced Popsicles (No red)                                      All solid food                                   Apple juices Sports drinks like Gatorade (No red) Lightly seasoned clear broth or consume(fat free) Sugar, honey syrup    Complete one Ensure drink the morning of surgery at 7:00 AM the day of surgery.      1. The day of surgery:   ? Drink ONE (1) Pre-Surgery Clear Ensure or G2 by am the morning of surgery. Drink in one sitting. Do not sip.  ? This drink was given to you during your  hospital  pre-op appointment visit. ? Nothing else to drink after completing the  Pre-Surgery Clear Ensure or G2.          If you have questions, please contact your surgeon's office.     Oral Hygiene is also important to reduce your risk of infection.                                    Remember - BRUSH YOUR TEETH THE MORNING OF SURGERY WITH YOUR REGULAR TOOTHPASTE   Do NOT smoke after Midnight   Take these medicines the morning of surgery with A SIP OF WATER: Zetia                               You may not have any metal on your body including hair pins, jewelry, and body piercings             Do not wear make-up, lotions, powders, perfumes/cologne, or deodorant             Do not wear nail polish.  Do not shave  48 hours prior to surgery.          Do not bring valuables to the hospital. [facility] IS NOT RESPONSIBLE   FOR VALUABLES.   Contacts, dentures or bridgework may not be worn into surgery.   Bring small overnight bag day of surgery.                Please read over the following fact sheets you were given: IF YOU HAVE QUESTIONS ABOUT YOUR PRE OP INSTRUCTIONS PLEASE CALL  [phone] [facility] - Preparing for Surgery Before surgery, you can play an important role.  Because skin is not sterile, your skin needs to be as free of germs as possible.  You can reduce the number of germs on your skin by washing with CHG (chlorahexidine gluconate) soap before surgery.  CHG is an antiseptic cleaner which kills germs and bonds with the skin to continue killing germs even after washing. Please DO NOT use if you have an allergy to CHG or antibacterial soaps.  If your skin becomes reddened/irritated stop using the CHG and inform your nurse when you arrive at Short Stay. Do not shave (including legs and underarms) for at  least 48 hours prior to the first CHG shower.  You may shave your face/neck.  Please follow these instructions carefully:  1.  Shower with CHG Soap the night before surgery and the  morning of surgery.  2.  If you choose to wash your hair, wash your hair first as usual with your normal  shampoo.  3.  After you shampoo, rinse your hair and body thoroughly to remove the shampoo.                             4.  Use CHG as you would any other liquid soap.  You can apply chg directly to the skin and wash.  Gently with a scrungie or clean washcloth.  5.  Apply the CHG Soap to your body ONLY FROM THE NECK DOWN.   Do   not use on face/ open  Wound or open sores. Avoid contact with eyes, ears mouth and   genitals (private parts).                       Wash face,  Genitals (private parts) with your normal soap.             6.  Wash thoroughly, paying special attention to the area where your    surgery  will be performed.  7.  Thoroughly rinse your body with warm water from the neck down.  8.  DO NOT shower/wash with your normal soap after using and rinsing off the CHG Soap.                9.  Pat yourself dry with a clean towel.            10.  Wear clean pajamas.            11.  Place clean sheets on your bed the night of your first shower and do not  sleep with pets. Day of Surgery : Do not apply any lotions/deodorants the morning of surgery.  Please wear clean clothes to the hospital/surgery center.  FAILURE TO FOLLOW THESE INSTRUCTIONS MAY RESULT IN THE CANCELLATION OF YOUR SURGERY  PATIENT SIGNATURE_________________________________  NURSE SIGNATURE__________________________________  ________________________________________________________________________   Amber Freeman  An incentive spirometer is a tool that can help keep your lungs clear and active. This tool measures how well you are filling your lungs with each breath. Taking long deep breaths may help  reverse or decrease the chance of developing breathing (pulmonary) problems (especially infection) following:  A long period of time when you are unable to move or be active. BEFORE THE PROCEDURE   If the spirometer includes an indicator to show your best effort, your nurse or respiratory therapist will set it to a desired goal.  If possible, sit up straight or lean slightly forward. Try not to slouch.  Hold the incentive spirometer in an upright position. INSTRUCTIONS FOR USE  1. Sit on the edge of your bed if possible, or sit up as far as you can in bed or on a chair. 2. Hold the incentive spirometer in an upright position. 3. Breathe out normally. 4. Place the mouthpiece in your mouth and seal your lips tightly around it. 5. Breathe in slowly and as deeply as possible, raising the piston or the ball toward the top of the column. 6. Hold your breath for 3-5 seconds or for as long as possible. Allow the piston or ball to fall to the bottom of the column. 7. Remove the mouthpiece from your mouth and breathe out normally. 8. Rest for a few seconds and repeat Steps 1 through 7 at least 10 times every 1-2 hours when you are awake. Take your time and take a few normal breaths between deep breaths. 9. The spirometer may include an indicator to show your best effort. Use the indicator as a goal to work toward during each repetition. 10. After each set of 10 deep breaths, practice coughing to be sure your lungs are clear. If you have an incision (the cut made at the time of surgery), support your incision when coughing by placing a pillow or rolled up towels firmly against it. Once you are able to get out of bed, walk around indoors and cough well. You may stop using the incentive spirometer when instructed by your caregiver.  RISKS AND COMPLICATIONS  Take your time  so you do not get dizzy or light-headed.  If you are in pain, you may need to take or ask for pain medication before doing incentive  spirometry. It is harder to take a deep breath if you are having pain. AFTER USE  Rest and breathe slowly and easily.  It can be helpful to keep track of a log of your progress. Your caregiver can provide you with a simple table to help with this. If you are using the spirometer at home, follow these instructions: Amber Freeman IF:   You are having difficultly using the spirometer.  You have trouble using the spirometer as often as instructed.  Your pain medication is not giving enough relief while using the spirometer.  You develop fever of 100.5 F (38.1 C) or higher. SEEK IMMEDIATE MEDICAL CARE IF:   You cough up bloody sputum that had not been present before.  You develop fever of 102 F (38.9 C) or greater.  You develop worsening pain at or near the incision site. MAKE SURE YOU:   Understand these instructions.  Will watch your condition.  Will get help right away if you are not doing well or get worse. Document Released: 04/25/2007 Document Revised: 03/06/2012 Document Reviewed: 06/26/2007 [employer] Patient Information 2014 [employer], [place].   ________________________________________________________________________  WHAT IS A BLOOD TRANSFUSION? Blood Transfusion Information  A transfusion is the replacement of blood or some of its parts. Blood is made up of multiple cells which provide different functions.  Red blood cells carry oxygen and are used for blood loss replacement.  White blood cells fight against infection.  Platelets control bleeding.  Plasma helps clot blood.  Other blood products are available for specialized needs, such as hemophilia or other clotting disorders. BEFORE THE TRANSFUSION  Who gives blood for transfusions?   Healthy volunteers who are fully evaluated to make sure their blood is safe. This is blood bank blood. Transfusion therapy is the safest it has ever been in the practice of medicine. Before blood is taken from a donor, a  complete history is taken to make sure that person has no history of diseases nor engages in risky social behavior (examples are intravenous drug use or sexual activity with multiple partners). The donor's travel history is screened to minimize risk of transmitting infections, such as malaria. The donated blood is tested for signs of infectious diseases, such as HIV and hepatitis. The blood is then tested to be sure it is compatible with you in order to minimize the chance of a transfusion reaction. If you or a relative donates blood, this is often done in anticipation of surgery and is not appropriate for emergency situations. It takes many days to process the donated blood. RISKS AND COMPLICATIONS Although transfusion therapy is very safe and saves many lives, the main dangers of transfusion include:   Getting an infectious disease.  Developing a transfusion reaction. This is an allergic reaction to something in the blood you were given. Every precaution is taken to prevent this. The decision to have a blood transfusion has been considered carefully by your caregiver before blood is given. Blood is not given unless the benefits outweigh the risks. AFTER THE TRANSFUSION  Right after receiving a blood transfusion, you will usually feel much better and more energetic. This is especially true if your red blood cells have gotten low (anemic). The transfusion raises the level of the red blood cells which carry oxygen, and this usually causes an energy increase.  The  nurse administering the transfusion will monitor you carefully for complications. HOME CARE INSTRUCTIONS  No special instructions are needed after a transfusion. You may find your energy is better. Speak with your caregiver about any limitations on activity for underlying diseases you may have. SEEK MEDICAL CARE IF:   Your condition is not improving after your transfusion.  You develop redness or irritation at the intravenous (IV)  site. SEEK IMMEDIATE MEDICAL CARE IF:  Any of the following symptoms occur over the next 12 hours:  Shaking chills.  You have a temperature by mouth above 102 F (38.9 C), not controlled by medicine.  Chest, back, or muscle pain.  People around you feel you are not acting correctly or are confused.  Shortness of breath or difficulty breathing.  Dizziness and fainting.  You get a rash or develop hives.  You have a decrease in urine output.  Your urine turns a dark color or changes to pink, red, or brown. Any of the following symptoms occur over the next 10 days:  You have a temperature by mouth above 102 F (38.9 C), not controlled by medicine.  Shortness of breath.  Weakness after normal activity.  The white part of the eye turns yellow (jaundice).  You have a decrease in the amount of urine or are urinating less often.  Your urine turns a dark color or changes to pink, red, or brown. Document Released: 12/10/2000 Document Revised: 03/06/2012 Document Reviewed: 07/29/2008 [facility] Patient Information 2014 [place], [place].  _______________________________________________________________________

## 2021-05-01 ENCOUNTER — Encounter (HOSPITAL_COMMUNITY): Payer: Self-pay

## 2021-05-01 ENCOUNTER — Encounter (HOSPITAL_COMMUNITY)
Admission: RE | Admit: 2021-05-01 | Discharge: 2021-05-01 | Disposition: A | Discharge status: Home / Self Care | Payer: Medicare Other | Source: Ambulatory Visit | Attending: Orthopedic Surgery | Admitting: Orthopedic Surgery

## 2021-05-01 ENCOUNTER — Other Ambulatory Visit: Payer: Self-pay

## 2021-05-01 DIAGNOSIS — Z01818 Encounter for other preprocedural examination: Secondary | ICD-10-CM | POA: Insufficient documentation

## 2021-05-01 HISTORY — DX: Unspecified cataract: H26.9

## 2021-05-01 LAB — COMPREHENSIVE METABOLIC PANEL
ALT: 24 U/L (ref 0–44)
AST: 21 U/L (ref 15–41)
Albumin: 4.4 g/dL (ref 3.5–5.0)
Alkaline Phosphatase: 66 U/L (ref 38–126)
Anion gap: 10 (ref 5–15)
BUN: 27 mg/dL — ABNORMAL HIGH (ref 8–23)
CO2: 25 mmol/L (ref 22–32)
Calcium: 10.2 mg/dL (ref 8.9–10.3)
Chloride: 107 mmol/L (ref 98–111)
Creatinine, Ser: 1.28 mg/dL — ABNORMAL HIGH (ref 0.44–1.00)
GFR, Estimated: 41 mL/min — ABNORMAL LOW (ref >60)
Glucose, Bld: 92 mg/dL (ref 70–99)
Potassium: 3.8 mmol/L (ref 3.5–5.1)
Sodium: 142 mmol/L (ref 135–145)
Total Bilirubin: 0.8 mg/dL (ref 0.3–1.2)
Total Protein: 7.9 g/dL (ref 6.5–8.1)

## 2021-05-01 LAB — CBC
HCT: 41 % (ref 36.0–46.0)
Hemoglobin: 13 g/dL (ref 12.0–15.0)
MCH: 31.5 pg (ref 26.0–34.0)
MCHC: 31.7 g/dL (ref 30.0–36.0)
MCV: 99.3 fL (ref 80.0–100.0)
Platelets: 182 10*3/uL (ref 150–400)
RBC: 4.13 MIL/uL (ref 3.87–5.11)
RDW: 13.5 % (ref 11.5–15.5)
WBC: 5.5 10*3/uL (ref 4.0–10.5)
nRBC: 0 % (ref 0.0–0.2)

## 2021-05-01 LAB — SURGICAL PCR SCREEN
MRSA, PCR: NEGATIVE
Staphylococcus aureus: NEGATIVE

## 2021-05-01 LAB — TYPE AND SCREEN
ABO/RH(D): O NEG
Antibody Screen: NEGATIVE

## 2021-05-01 LAB — PROTIME-INR
INR: 1 (ref 0.8–1.2)
Prothrombin Time: 13.3 seconds (ref 11.4–15.2)

## 2021-05-01 LAB — APTT: aPTT: 41 seconds — ABNORMAL HIGH (ref 24–36)

## 2021-05-01 NOTE — Progress Notes (Signed)
PTT results sent to Dr. Charlann Boxer to review.

## 2021-05-08 ENCOUNTER — Other Ambulatory Visit (HOSPITAL_COMMUNITY)
Admission: RE | Admit: 2021-05-08 | Discharge: 2021-05-08 | Disposition: A | Discharge status: Home / Self Care | Payer: Medicare Other | Source: Ambulatory Visit | Attending: Orthopedic Surgery | Admitting: Orthopedic Surgery

## 2021-05-08 DIAGNOSIS — Z01812 Encounter for preprocedural laboratory examination: Secondary | ICD-10-CM | POA: Insufficient documentation

## 2021-05-08 DIAGNOSIS — Z20822 Contact with and (suspected) exposure to covid-19: Secondary | ICD-10-CM | POA: Insufficient documentation

## 2021-05-09 LAB — SARS CORONAVIRUS 2 (TAT 6-24 HRS): SARS Coronavirus 2: NEGATIVE

## 2021-05-11 NOTE — H&P (Signed)
TOTAL KNEE ADMISSION H&P  Patient is being admitted for right total knee arthroplasty.  Subjective:  Chief Complaint:right knee pain.  HPI: Amber Freeman, 87 y.o. female, has a history of pain and functional disability in the right knee due to arthritis and has failed non-surgical conservative treatments for greater than 12 weeks to includecorticosteriod injections and activity modification.  Onset of symptoms was gradual, starting 2 years ago with gradually worsening course since that time. The patient noted no past surgery on the right knee(s).  Patient currently rates pain in the right knee(s) at 8 out of 10 with activity. Patient has worsening of pain with activity and weight bearing and pain that interferes with activities of daily living.  Patient has evidence of joint space narrowing by imaging studies.. There is no active infection.  There are no problems to display for this patient.  Past Medical History:  Diagnosis Date  . Arthritis   . Cataract    Left eye  . Hypercholesteremia   . Hypertension     Past Surgical History:  Procedure Laterality Date  . CATARACT EXTRACTION W/ INTRAOCULAR LENS IMPLANT Right   . WISDOM TOOTH EXTRACTION      No current facility-administered medications for this encounter.   Current Outpatient Medications  Medication Sig Dispense Refill Last Dose  . acetaminophen (TYLENOL) 650 MG CR tablet Take 1,300 mg by mouth daily as needed for pain.     Marland Kitchen ezetimibe (ZETIA) 10 MG tablet Take 10 mg by mouth daily.     Marland Kitchen lisinopril-hydrochlorothiazide (ZESTORETIC) 20-25 MG tablet Take 1 tablet by mouth daily.     Marland Kitchen LUMIGAN 0.01 % SOLN Place 1 drop into both eyes at bedtime.     . meloxicam (MOBIC) 15 MG tablet Take 15 mg by mouth daily as needed for pain (Arthritis).     . potassium chloride (KLOR-CON) 10 MEQ tablet Take 10 mEq by mouth daily.     . pravastatin (PRAVACHOL) 80 MG tablet Take 80 mg by mouth at bedtime.      No Known Allergies  Social History    Tobacco Use  . Smoking status: Never Smoker  . Smokeless tobacco: Never Used  Substance Use Topics  . Alcohol use: No    No family history on file.   Review of Systems  Constitutional: Negative for chills and fever.  Respiratory: Negative for cough and shortness of breath.   Cardiovascular: Negative for chest pain.  Gastrointestinal: Negative for nausea and vomiting.  Musculoskeletal: Positive for arthralgias.    Objective:  Physical Exam Well nourished and well developed. General: Alert and oriented x3, cooperative and pleasant, no acute distress. Head: normocephalic, atraumatic, neck supple. Eyes: EOMI.  Musculoskeletal: Bilateral knee exams: No palpable effusion, warmth erythema Tenderness predominantly over the medial and anterior aspect the knees Slight flexion contracture with flexion to 120 degrees Stable medial lateral collateral ligaments Normal bilateral hip range of motion without groin pain or referred pain  Calves soft and nontender. Motor function intact in LE. Strength 5/5 LE bilaterally. Neuro: Distal pulses 2+. Sensation to light touch intact in LE. Vital signs in last 24 hours:    Labs:   Estimated body mass index is 26.61 kg/m as calculated from the following:   Height as of 05/01/21: 5\' 3"  (1.6 m).   Weight as of 05/01/21: 68.1 kg.   Imaging Review Plain radiographs demonstrate severe degenerative joint disease of the right knee(s). The overall alignment isneutral. The bone quality appears to be adequate  for age and reported activity level.   Assessment/Plan:  End stage arthritis, right knee   The patient history, physical examination, clinical judgment of the provider and imaging studies are consistent with end stage degenerative joint disease of the right knee(s) and total knee arthroplasty is deemed medically necessary. The treatment options including medical management, injection therapy arthroscopy and arthroplasty were discussed at  length. The risks and benefits of total knee arthroplasty were presented and reviewed. The risks due to aseptic loosening, infection, stiffness, patella tracking problems, thromboembolic complications and other imponderables were discussed. The patient acknowledged the explanation, agreed to proceed with the plan and consent was signed. Patient is being admitted for inpatient treatment for surgery, pain control, PT, OT, prophylactic antibiotics, VTE prophylaxis, progressive ambulation and ADL's and discharge planning. The patient is planning to be discharged home.  Therapy Plans: outpatient therapy at [facility] Disposition: Home with daughter Planned DVT Prophylaxis: aspirin 81mg  BID DME needed: none PCP: Dr. , clearance received TXA: IV Allergies: NKDA Anesthesia Concerns: none BMI: 27.4 Last HgbA1c: Not diabetic  Other: - Hydrocodone is okay  Patient's anticipated LOS is less than 2 midnights, meeting these requirements: - Younger than 46 - Lives within 1 hour of care - Has a competent adult at home to recover with post-op recover - NO history of  - Chronic pain requiring opiods  - Diabetes  - Coronary Artery Disease  - Heart failure  - Heart attack  - Stroke  - DVT/VTE  - Cardiac arrhythmia  - Respiratory Failure/COPD  - Renal failure  - Anemia  - Advanced Liver disease   78, PA-C Orthopedic Surgery [facility] [place] [phone]

## 2021-05-12 ENCOUNTER — Observation Stay (HOSPITAL_COMMUNITY)
Admission: RE | Admit: 2021-05-12 | Discharge: 2021-05-13 | Disposition: A | Discharge status: Home / Self Care | Payer: Medicare Other | Source: Ambulatory Visit | Attending: Orthopedic Surgery | Admitting: Orthopedic Surgery

## 2021-05-12 ENCOUNTER — Encounter (HOSPITAL_COMMUNITY)
Admission: RE | Disposition: A | Discharge status: Home / Self Care | Payer: Self-pay | Source: Ambulatory Visit | Attending: Orthopedic Surgery

## 2021-05-12 ENCOUNTER — Encounter (HOSPITAL_COMMUNITY): Payer: Self-pay | Admitting: Orthopedic Surgery

## 2021-05-12 ENCOUNTER — Ambulatory Visit (HOSPITAL_COMMUNITY): Payer: Medicare Other | Admitting: Physician Assistant

## 2021-05-12 ENCOUNTER — Ambulatory Visit (HOSPITAL_COMMUNITY): Payer: Medicare Other | Admitting: Certified Registered"

## 2021-05-12 ENCOUNTER — Other Ambulatory Visit: Payer: Self-pay

## 2021-05-12 DIAGNOSIS — Z96651 Presence of right artificial knee joint: Secondary | ICD-10-CM

## 2021-05-12 DIAGNOSIS — M1711 Unilateral primary osteoarthritis, right knee: Secondary | ICD-10-CM | POA: Diagnosis present

## 2021-05-12 DIAGNOSIS — I10 Essential (primary) hypertension: Secondary | ICD-10-CM | POA: Diagnosis not present

## 2021-05-12 DIAGNOSIS — Z79899 Other long term (current) drug therapy: Secondary | ICD-10-CM | POA: Diagnosis not present

## 2021-05-12 HISTORY — PX: TOTAL KNEE ARTHROPLASTY: SHX125

## 2021-05-12 SURGERY — ARTHROPLASTY, KNEE, TOTAL
Anesthesia: Spinal | Site: Knee | Laterality: Right

## 2021-05-12 MED ORDER — SODIUM CHLORIDE 0.9 % IR SOLN
Status: DC | PRN
  Administered 2021-05-12: 3000 mL

## 2021-05-12 MED ORDER — KETOROLAC TROMETHAMINE 30 MG/ML IJ SOLN
INTRAMUSCULAR | Status: DC | PRN
  Administered 2021-05-12: 30 mg via INTRAMUSCULAR

## 2021-05-12 MED ORDER — ROPIVACAINE HCL 5 MG/ML IJ SOLN
INTRAMUSCULAR | Status: DC | PRN
  Administered 2021-05-12: 30 mL via PERINEURAL

## 2021-05-12 MED ORDER — DEXAMETHASONE SODIUM PHOSPHATE 10 MG/ML IJ SOLN: 8.0000 mg | Freq: Once | INTRAMUSCULAR | Status: DC

## 2021-05-12 MED ORDER — HYDROCODONE-ACETAMINOPHEN 7.5-325 MG PO TABS
1.0000 | ORAL_TABLET | ORAL | Status: DC | PRN
Start: 2021-05-12 — End: 2021-05-13
  Administered 2021-05-13 (×2): 1 via ORAL
  Filled 2021-05-12 (×2): qty 1

## 2021-05-12 MED ORDER — ASPIRIN 81 MG PO CHEW
81.0000 mg | CHEWABLE_TABLET | Freq: Two times a day (BID) | ORAL | Status: DC
  Administered 2021-05-12 – 2021-05-13 (×2): 81 mg via ORAL
  Filled 2021-05-12 (×2): qty 1

## 2021-05-12 MED ORDER — FENTANYL CITRATE (PF) 100 MCG/2ML IJ SOLN
50.0000 ug | INTRAMUSCULAR | Status: DC
  Administered 2021-05-12: 100 ug via INTRAVENOUS
  Filled 2021-05-12: qty 2

## 2021-05-12 MED ORDER — DEXAMETHASONE SODIUM PHOSPHATE 10 MG/ML IJ SOLN
INTRAMUSCULAR | Status: DC | PRN
  Administered 2021-05-12: 8 mg via INTRAVENOUS

## 2021-05-12 MED ORDER — PHENOL 1.4 % MT LIQD: 1.0000 | OROMUCOSAL | Status: DC | PRN

## 2021-05-12 MED ORDER — DEXAMETHASONE SODIUM PHOSPHATE 10 MG/ML IJ SOLN
10.0000 mg | Freq: Once | INTRAMUSCULAR | Status: AC
  Administered 2021-05-13: 10 mg via INTRAVENOUS
  Filled 2021-05-12: qty 1

## 2021-05-12 MED ORDER — METOCLOPRAMIDE HCL 5 MG/ML IJ SOLN: 5.0000 mg | Freq: Three times a day (TID) | INTRAMUSCULAR | Status: DC | PRN

## 2021-05-12 MED ORDER — ONDANSETRON HCL 4 MG/2ML IJ SOLN
INTRAMUSCULAR | Status: DC | PRN
  Administered 2021-05-12: 4 mg via INTRAVENOUS

## 2021-05-12 MED ORDER — PHENYLEPHRINE HCL-NACL 10-0.9 MG/250ML-% IV SOLN
INTRAVENOUS | Status: DC | PRN
  Administered 2021-05-12: 25 ug/min via INTRAVENOUS

## 2021-05-12 MED ORDER — LATANOPROST 0.005 % OP SOLN
1.0000 [drp] | Freq: Every day | OPHTHALMIC | Status: DC
  Administered 2021-05-12: 1 [drp] via OPHTHALMIC
  Filled 2021-05-12: qty 2.5

## 2021-05-12 MED ORDER — DOCUSATE SODIUM 100 MG PO CAPS
100.0000 mg | ORAL_CAPSULE | Freq: Two times a day (BID) | ORAL | Status: DC
  Administered 2021-05-12 – 2021-05-13 (×2): 100 mg via ORAL
  Filled 2021-05-12 (×2): qty 1

## 2021-05-12 MED ORDER — ORAL CARE MOUTH RINSE: 15.0000 mL | Freq: Once | OROMUCOSAL | Status: AC

## 2021-05-12 MED ORDER — MENTHOL 3 MG MT LOZG: 1.0000 | LOZENGE | OROMUCOSAL | Status: DC | PRN

## 2021-05-12 MED ORDER — LISINOPRIL-HYDROCHLOROTHIAZIDE 20-25 MG PO TABS: 1.0000 | ORAL_TABLET | Freq: Every day | ORAL | Status: DC

## 2021-05-12 MED ORDER — BUPIVACAINE-EPINEPHRINE (PF) 0.25% -1:200000 IJ SOLN
INTRAMUSCULAR | Status: DC | PRN
  Administered 2021-05-12: 30 mL via PERINEURAL

## 2021-05-12 MED ORDER — BISACODYL 10 MG RE SUPP: 10.0000 mg | Freq: Every day | RECTAL | Status: DC | PRN

## 2021-05-12 MED ORDER — SODIUM CHLORIDE 0.9 % IV SOLN: INTRAVENOUS | Status: DC

## 2021-05-12 MED ORDER — EZETIMIBE 10 MG PO TABS
10.0000 mg | ORAL_TABLET | Freq: Every day | ORAL | Status: DC
  Administered 2021-05-12 – 2021-05-13 (×2): 10 mg via ORAL
  Filled 2021-05-12 (×2): qty 1

## 2021-05-12 MED ORDER — ONDANSETRON HCL 4 MG/2ML IJ SOLN: 4.0000 mg | Freq: Four times a day (QID) | INTRAMUSCULAR | Status: DC | PRN

## 2021-05-12 MED ORDER — TRANEXAMIC ACID-NACL 1000-0.7 MG/100ML-% IV SOLN
1000.0000 mg | INTRAVENOUS | Status: AC
  Administered 2021-05-12: 1000 mg via INTRAVENOUS
  Filled 2021-05-12: qty 100

## 2021-05-12 MED ORDER — POVIDONE-IODINE 10 % EX SWAB
2.0000 "application " | Freq: Once | CUTANEOUS | Status: AC
  Administered 2021-05-12: 2 via TOPICAL

## 2021-05-12 MED ORDER — ONDANSETRON HCL 4 MG/2ML IJ SOLN: 4.0000 mg | Freq: Once | INTRAMUSCULAR | Status: DC | PRN

## 2021-05-12 MED ORDER — HYDROCODONE-ACETAMINOPHEN 5-325 MG PO TABS
1.0000 | ORAL_TABLET | ORAL | Status: DC | PRN
  Administered 2021-05-12 (×2): 1 via ORAL
  Filled 2021-05-12 (×2): qty 1

## 2021-05-12 MED ORDER — OXYCODONE HCL 5 MG PO TABS: 5.0000 mg | ORAL_TABLET | Freq: Once | ORAL | Status: DC | PRN

## 2021-05-12 MED ORDER — LACTATED RINGERS IV SOLN: INTRAVENOUS | Status: DC

## 2021-05-12 MED ORDER — SODIUM CHLORIDE (PF) 0.9 % IJ SOLN
INTRAMUSCULAR | Status: AC
  Filled 2021-05-12: qty 30

## 2021-05-12 MED ORDER — CELECOXIB 200 MG PO CAPS
200.0000 mg | ORAL_CAPSULE | Freq: Two times a day (BID) | ORAL | Status: DC
  Administered 2021-05-12: 200 mg via ORAL
  Filled 2021-05-12: qty 1

## 2021-05-12 MED ORDER — OXYCODONE HCL 5 MG/5ML PO SOLN: 5.0000 mg | Freq: Once | ORAL | Status: DC | PRN

## 2021-05-12 MED ORDER — HYDROCHLOROTHIAZIDE 25 MG PO TABS
25.0000 mg | ORAL_TABLET | Freq: Every day | ORAL | Status: DC
  Administered 2021-05-13: 25 mg via ORAL
  Filled 2021-05-12: qty 1

## 2021-05-12 MED ORDER — LISINOPRIL 20 MG PO TABS
20.0000 mg | ORAL_TABLET | Freq: Every day | ORAL | Status: DC
  Administered 2021-05-13: 20 mg via ORAL
  Filled 2021-05-12: qty 1

## 2021-05-12 MED ORDER — MORPHINE SULFATE (PF) 2 MG/ML IV SOLN
0.5000 mg | INTRAVENOUS | Status: DC | PRN
  Administered 2021-05-12: 0.5 mg via INTRAVENOUS
  Filled 2021-05-12: qty 1

## 2021-05-12 MED ORDER — POTASSIUM CHLORIDE ER 10 MEQ PO TBCR
10.0000 meq | EXTENDED_RELEASE_TABLET | Freq: Every day | ORAL | Status: DC
  Administered 2021-05-13: 10 meq via ORAL
  Filled 2021-05-12 (×2): qty 1

## 2021-05-12 MED ORDER — CHLORHEXIDINE GLUCONATE CLOTH 2 % EX PADS: 6.0000 | MEDICATED_PAD | Freq: Every day | CUTANEOUS | Status: DC

## 2021-05-12 MED ORDER — METHOCARBAMOL 500 MG IVPB - SIMPLE MED
500.0000 mg | Freq: Four times a day (QID) | INTRAVENOUS | Status: DC | PRN
  Filled 2021-05-12: qty 50

## 2021-05-12 MED ORDER — CEFAZOLIN SODIUM-DEXTROSE 2-4 GM/100ML-% IV SOLN
2.0000 g | INTRAVENOUS | Status: AC
  Administered 2021-05-12: 2 g via INTRAVENOUS
  Filled 2021-05-12: qty 100

## 2021-05-12 MED ORDER — POLYETHYLENE GLYCOL 3350 17 G PO PACK: 17.0000 g | PACK | Freq: Every day | ORAL | Status: DC | PRN

## 2021-05-12 MED ORDER — BUPIVACAINE IN DEXTROSE 0.75-8.25 % IT SOLN
INTRATHECAL | Status: DC | PRN
  Administered 2021-05-12: 1.6 mL via INTRATHECAL

## 2021-05-12 MED ORDER — CEFAZOLIN SODIUM-DEXTROSE 2-4 GM/100ML-% IV SOLN
2.0000 g | Freq: Four times a day (QID) | INTRAVENOUS | Status: AC
  Administered 2021-05-12 (×2): 2 g via INTRAVENOUS
  Filled 2021-05-12 (×2): qty 100

## 2021-05-12 MED ORDER — PROPOFOL 500 MG/50ML IV EMUL
INTRAVENOUS | Status: DC | PRN
  Administered 2021-05-12: 75 ug/kg/min via INTRAVENOUS

## 2021-05-12 MED ORDER — SODIUM CHLORIDE (PF) 0.9 % IJ SOLN
INTRAMUSCULAR | Status: DC | PRN
  Administered 2021-05-12: 30 mL

## 2021-05-12 MED ORDER — BUPIVACAINE-EPINEPHRINE (PF) 0.25% -1:200000 IJ SOLN
INTRAMUSCULAR | Status: AC
  Filled 2021-05-12: qty 30

## 2021-05-12 MED ORDER — METOCLOPRAMIDE HCL 5 MG PO TABS: 5.0000 mg | ORAL_TABLET | Freq: Three times a day (TID) | ORAL | Status: DC | PRN

## 2021-05-12 MED ORDER — METHOCARBAMOL 500 MG PO TABS
500.0000 mg | ORAL_TABLET | Freq: Four times a day (QID) | ORAL | Status: DC | PRN
  Administered 2021-05-13: 500 mg via ORAL
  Filled 2021-05-12: qty 1

## 2021-05-12 MED ORDER — FENTANYL CITRATE (PF) 100 MCG/2ML IJ SOLN: 25.0000 ug | INTRAMUSCULAR | Status: DC | PRN

## 2021-05-12 MED ORDER — PROPOFOL 10 MG/ML IV BOLUS
INTRAVENOUS | Status: DC | PRN
  Administered 2021-05-12: 20 mg via INTRAVENOUS

## 2021-05-12 MED ORDER — PRAVASTATIN SODIUM 20 MG PO TABS
80.0000 mg | ORAL_TABLET | Freq: Every day | ORAL | Status: DC
  Administered 2021-05-12: 80 mg via ORAL
  Filled 2021-05-12: qty 4

## 2021-05-12 MED ORDER — DIPHENHYDRAMINE HCL 12.5 MG/5ML PO ELIX
12.5000 mg | ORAL_SOLUTION | ORAL | Status: DC | PRN
Start: 2021-05-12 — End: 2021-05-13

## 2021-05-12 MED ORDER — CHLORHEXIDINE GLUCONATE 0.12 % MT SOLN
15.0000 mL | Freq: Once | OROMUCOSAL | Status: AC
  Administered 2021-05-12: 15 mL via OROMUCOSAL

## 2021-05-12 MED ORDER — FERROUS SULFATE 325 (65 FE) MG PO TABS
325.0000 mg | ORAL_TABLET | Freq: Three times a day (TID) | ORAL | Status: DC
  Administered 2021-05-12 – 2021-05-13 (×2): 325 mg via ORAL
  Filled 2021-05-12 (×2): qty 1

## 2021-05-12 MED ORDER — TRANEXAMIC ACID-NACL 1000-0.7 MG/100ML-% IV SOLN
1000.0000 mg | Freq: Once | INTRAVENOUS | Status: AC
  Administered 2021-05-12: 1000 mg via INTRAVENOUS
  Filled 2021-05-12: qty 100

## 2021-05-12 MED ORDER — KETOROLAC TROMETHAMINE 30 MG/ML IJ SOLN
INTRAMUSCULAR | Status: AC
  Filled 2021-05-12: qty 1

## 2021-05-12 MED ORDER — LIDOCAINE 2% (20 MG/ML) 5 ML SYRINGE
INTRAMUSCULAR | Status: DC | PRN
  Administered 2021-05-12: 40 mg via INTRAVENOUS

## 2021-05-12 MED ORDER — ACETAMINOPHEN 325 MG PO TABS: 325.0000 mg | ORAL_TABLET | Freq: Four times a day (QID) | ORAL | Status: DC | PRN

## 2021-05-12 MED ORDER — ONDANSETRON HCL 4 MG PO TABS: 4.0000 mg | ORAL_TABLET | Freq: Four times a day (QID) | ORAL | Status: DC | PRN

## 2021-05-12 SURGICAL SUPPLY — 47 items
ATTUNE MED ANAT PAT 35 KNEE (Knees) ×2 IMPLANT
ATTUNE PSFEM RTSZ4 NARCEM KNEE (Femur) ×2 IMPLANT
ATTUNE PSRP INSR SZ4 7 KNEE (Insert) ×2 IMPLANT
BASEPLATE TIBIAL ROTATING SZ 4 (Knees) ×2 IMPLANT
BLADE SAW SGTL 11.0X1.19X90.0M (BLADE) ×2 IMPLANT
BLADE SAW SGTL 13.0X1.19X90.0M (BLADE) ×2 IMPLANT
BLADE SURG SZ10 CARB STEEL (BLADE) ×4 IMPLANT
BNDG ELASTIC 6X5.8 VLCR STR LF (GAUZE/BANDAGES/DRESSINGS) ×2 IMPLANT
BOWL SMART MIX CTS (DISPOSABLE) ×2 IMPLANT
CEMENT HV SMART SET (Cement) ×4 IMPLANT
CUFF TOURN SGL QUICK 34 (TOURNIQUET CUFF) ×2
CUFF TRNQT CYL 34X4.125X (TOURNIQUET CUFF) ×1 IMPLANT
DERMABOND ADVANCED (GAUZE/BANDAGES/DRESSINGS) ×1
DERMABOND ADVANCED .7 DNX12 (GAUZE/BANDAGES/DRESSINGS) ×1 IMPLANT
DRAPE U-SHAPE 47X51 STRL (DRAPES) ×2 IMPLANT
DRESSING AQUACEL AG SP 3.5X10 (GAUZE/BANDAGES/DRESSINGS) ×1 IMPLANT
DRSG AQUACEL AG SP 3.5X10 (GAUZE/BANDAGES/DRESSINGS) ×2
DURAPREP 26ML APPLICATOR (WOUND CARE) ×4 IMPLANT
ELECT REM PT RETURN 15FT ADLT (MISCELLANEOUS) ×2 IMPLANT
GLOVE SURG ENC MOIS LTX SZ6 (GLOVE) IMPLANT
GLOVE SURG ORTHO LTX SZ7.5 (GLOVE) ×2 IMPLANT
GLOVE SURG UNDER LTX SZ7.5 (GLOVE) ×2 IMPLANT
GLOVE SURG UNDER POLY LF SZ6.5 (GLOVE) IMPLANT
GOWN STRL REUS W/TWL LRG LVL3 (GOWN DISPOSABLE) ×2 IMPLANT
HANDPIECE INTERPULSE COAX TIP (DISPOSABLE) ×2
HOLDER FOLEY CATH W/STRAP (MISCELLANEOUS) IMPLANT
KIT TURNOVER KIT A (KITS) ×2 IMPLANT
MANIFOLD NEPTUNE II (INSTRUMENTS) ×2 IMPLANT
NDL SAFETY ECLIPSE 18X1.5 (NEEDLE) ×1 IMPLANT
NEEDLE HYPO 18GX1.5 SHARP (NEEDLE) ×2
NS IRRIG 1000ML POUR BTL (IV SOLUTION) ×2 IMPLANT
PACK TOTAL KNEE CUSTOM (KITS) ×2 IMPLANT
PENCIL SMOKE EVACUATOR (MISCELLANEOUS) ×2 IMPLANT
PIN DRILL FIX HALF THREAD (BIT) ×2 IMPLANT
PIN FIX SIGMA LCS THRD HI (PIN) ×2 IMPLANT
PROTECTOR NERVE ULNAR (MISCELLANEOUS) ×2 IMPLANT
SET HNDPC FAN SPRY TIP SCT (DISPOSABLE) ×1 IMPLANT
SET PAD KNEE POSITIONER (MISCELLANEOUS) ×2 IMPLANT
SUT MNCRL AB 4-0 PS2 18 (SUTURE) ×2 IMPLANT
SUT STRATAFIX PDS+ 0 24IN (SUTURE) ×2 IMPLANT
SUT VIC AB 1 CT1 36 (SUTURE) ×2 IMPLANT
SUT VIC AB 2-0 CT1 27 (SUTURE) ×6
SUT VIC AB 2-0 CT1 TAPERPNT 27 (SUTURE) ×3 IMPLANT
TRAY FOLEY MTR SLVR 16FR STAT (SET/KITS/TRAYS/PACK) ×2 IMPLANT
TUBE SUCTION HIGH CAP CLEAR NV (SUCTIONS) ×2 IMPLANT
WATER STERILE IRR 1000ML POUR (IV SOLUTION) ×4 IMPLANT
WRAP KNEE MAXI GEL POST OP (GAUZE/BANDAGES/DRESSINGS) ×2 IMPLANT

## 2021-05-12 NOTE — Transfer of Care (Signed)
Immediate Anesthesia Transfer of Care Note  Patient: Amber Freeman  Procedure(s) Performed: TOTAL KNEE ARTHROPLASTY (Right Knee)  Patient Location: PACU  Anesthesia Type:Spinal  Level of Consciousness: awake and alert   Airway & Oxygen Therapy: Patient Spontanous Breathing and Patient connected to face mask oxygen  Post-op Assessment: Report given to RN and Post -op Vital signs reviewed and stable  Post vital signs: Reviewed and stable  Last Vitals:  Vitals Value Taken Time  BP 112/77 05/12/21 1143  Temp    Pulse 72 05/12/21 1144  Resp 14 05/12/21 1144  SpO2 100 % 05/12/21 1144  Vitals shown include unvalidated device data.  Last Pain:  Vitals:   05/12/21 0756  TempSrc:   PainSc: 6       Patients Stated Pain Goal: 4 (05/12/21 0756)  Complications: No complications documented.

## 2021-05-12 NOTE — Anesthesia Preprocedure Evaluation (Signed)
Anesthesia Evaluation  Patient identified by MRN, date of birth, ID band Patient awake    Reviewed: Allergy & Precautions, NPO status , Patient's Chart, lab work & pertinent test results  History of Anesthesia Complications Negative for: history of anesthetic complications  Airway Mallampati: II  TM Distance: >3 FB Neck ROM: Full    Dental   Pulmonary neg pulmonary ROS,    Pulmonary exam normal        Cardiovascular hypertension, Normal cardiovascular exam     Neuro/Psych negative neurological ROS     GI/Hepatic negative GI ROS, Neg liver ROS,   Endo/Other  negative endocrine ROS  Renal/GU Renal disease (Cr 1.28)  negative genitourinary   Musculoskeletal  (+) Arthritis , Osteoarthritis,    Abdominal   Peds  Hematology negative hematology ROS (+)   Anesthesia Other Findings   Reproductive/Obstetrics                             Anesthesia Physical Anesthesia Plan  ASA: II  Anesthesia Plan: Spinal   Post-op Pain Management:    Induction:   PONV Risk Score and Plan: 2 and Propofol infusion, Treatment may vary due to age or medical condition, Ondansetron and TIVA  Airway Management Planned: Nasal Cannula and Simple Face Mask  Additional Equipment: None  Intra-op Plan:   Post-operative Plan:   Informed Consent: I have reviewed the patients History and Physical, chart, labs and discussed the procedure including the risks, benefits and alternatives for the proposed anesthesia with the patient or authorized representative who has indicated his/her understanding and acceptance.       Plan Discussed with:   Anesthesia Plan Comments:         Anesthesia Quick Evaluation

## 2021-05-12 NOTE — Interval H&P Note (Signed)
History and Physical Interval Note:  05/12/2021 8:48 AM  Amber Freeman  has presented today for surgery, with the diagnosis of Right knee osteoarthritis.  The various methods of treatment have been discussed with the patient and family. After consideration of risks, benefits and other options for treatment, the patient has consented to  Procedure(s) with comments: TOTAL KNEE ARTHROPLASTY (Right) - 70 mins as a surgical intervention.  The patient's history has been reviewed, patient examined, no change in status, stable for surgery.  I have reviewed the patient's chart and labs.  Questions were answered to the patient's satisfaction.     Shelda Pal

## 2021-05-12 NOTE — Anesthesia Procedure Notes (Signed)
Date/Time: 05/12/2021 10:02 AM Performed by: Minerva Ends, CRNA Pre-anesthesia Checklist: Patient identified Patient Re-evaluated:Patient Re-evaluated prior to induction Oxygen Delivery Method: Simple face mask Placement Confirmation: positive ETCO2 and breath sounds checked- equal and bilateral Dental Injury: Teeth and Oropharynx as per pre-operative assessment

## 2021-05-12 NOTE — Progress Notes (Signed)
AssistedDr. Carolyn Witman with right, ultrasound guided, adductor canal block. Side rails up, monitors on throughout procedure. See vital signs in flow sheet. Tolerated Procedure well.  

## 2021-05-12 NOTE — Anesthesia Procedure Notes (Signed)
Spinal  Patient location during procedure: OR Start time: 05/12/2021 10:10 AM Reason for block: surgical anesthesia Staffing Performed: resident/CRNA  Resident/CRNA: Minerva Ends, CRNA Preanesthetic Checklist Completed: patient identified, IV checked, site marked, risks and benefits discussed, surgical consent, monitors and equipment checked, pre-op evaluation and timeout performed Spinal Block Patient position: sitting Prep: DuraPrep and site prepped and draped Patient monitoring: heart rate, continuous pulse ox, blood pressure and cardiac monitor Approach: midline Location: L3-4 Injection technique: single-shot Needle Needle type: Sprotte  Needle gauge: 24 G Assessment Events: CSF return Additional Notes Expiration date of tray noted and within date.   Patient tolerated procedure well. Prep dry at time of procedure- Witman present assisted and supervised

## 2021-05-12 NOTE — Anesthesia Procedure Notes (Signed)
Anesthesia Regional Block: Adductor canal block   Pre-Anesthetic Checklist: ,, timeout performed, Correct Patient, Correct Site, Correct Laterality, Correct Procedure, Correct Position, site marked, Risks and benefits discussed,  Surgical consent,  Pre-op evaluation,  At surgeon's request and post-op pain management  Laterality: Right  Prep: chloraprep       Needles:  Injection technique: Single-shot  Needle Type: Echogenic Stimulator Needle     Needle Length: 10cm  Needle Gauge: 20     Additional Needles:   Procedures:,,,, ultrasound used (permanent image in chart),,,,  Narrative:  Start time: 05/12/2021 9:30 AM End time: 05/12/2021 9:34 AM Injection made incrementally with aspirations every 5 mL.  Performed by: Personally  Anesthesiologist: Lucretia Kern, MD  Additional Notes: Standard monitors applied. Skin prepped. Good needle visualization with ultrasound. Injection made in 5cc increments with no resistance to injection. Patient tolerated the procedure well.

## 2021-05-12 NOTE — Plan of Care (Signed)
Discussed with patient and daughters about plan of care for post-op day 0.   Will continue to monitor patient.    SWhittemore, Charity fundraiser

## 2021-05-12 NOTE — Evaluation (Signed)
Physical Therapy Evaluation Patient Details Name: Amber Freeman MRN: [id number] DOB: 10/04/1932 Today's Date: 05/12/2021   History of Present Illness  Patient is 87 y.o. female s/p Rt TKA on 05/12/21 with PMH significant for OA, HTN, cataract.  Clinical Impression  Amber Freeman is a 87 y.o. female POD 0 s/p Rt TKA. Patient reports independence with mobility at baseline. Patient is now limited by functional impairments (see PT problem list below) and requires min assist for transfers and gait with RW. Patient was able to ambulate ~12 feet with RW and min assist and manual facilitation of Rt knee in stance to prevent buckling. Pt's use of UE's improved throughout. Patient instructed in exercise to facilitate circulation to manage edema and reduce risk of DVT. Patient will benefit from continued skilled PT interventions to address impairments and progress towards PLOF. Acute PT will follow to progress mobility and stair training in preparation for safe discharge home.     Follow Up Recommendations Follow surgeon's recommendation for DC plan and follow-up therapies;Outpatient PT    Equipment Recommendations  None recommended by PT    Recommendations for Other Services       Precautions / Restrictions Precautions Precautions: Fall Restrictions Weight Bearing Restrictions: No Other Position/Activity Restrictions: WBAT      Mobility  Bed Mobility Overal bed mobility: Needs Assistance Bed Mobility: Supine to Sit     Supine to sit: Min assist;HOB elevated     General bed mobility comments: Assist to bring bil LE's off EOB and use of bed pad to scoot to EOB.    Transfers Overall transfer level: Needs assistance Equipment used: Rolling walker (2 wheeled) Transfers: Sit to/from Stand Sit to Stand: Min assist;From elevated surface         General transfer comment: Assist for power up and to steady in standing. cues for technique/hand placement with  RW.  Ambulation/Gait Ambulation/Gait assistance: Min assist Gait Distance (Feet): 12 Feet Assistive device: Rolling walker (2 wheeled) Gait Pattern/deviations: Step-to pattern;Decreased stride length;Decreased stance time - right;Decreased weight shift to right Gait velocity: decr   General Gait Details: cues for step pattern and use of UE's to reduce weight on Rt LE to prevent buckling. manual blocking of Rt knee provided to facilitate extension in stance. min assist for walker position/advancement. pt c/o dizziness and distance limited. symptoms resolved in sitting.  Stairs            Wheelchair Mobility    Modified Rankin (Stroke Patients Only)       Balance Overall balance assessment: Needs assistance Sitting-balance support: Feet supported Sitting balance-Leahy Scale: Good     Standing balance support: During functional activity;Bilateral upper extremity supported Standing balance-Leahy Scale: Poor                               Pertinent Vitals/Pain Pain Assessment: 0-10 Pain Score: 4  Pain Location: Rt knee Pain Descriptors / Indicators: Aching;Burning;Discomfort Pain Intervention(s): Limited activity within patient's tolerance;Monitored during session;Repositioned;Ice applied    Home Living Family/patient expects to be discharged to:: Private residence Living Arrangements: Other relatives (daughters) Available Help at Discharge: Family Type of Home: House Home Access: Stairs to enter Entrance Stairs-Rails: Can reach both Entrance Stairs-Number of Steps: 4 Home Layout: One level Home Equipment: Environmental consultant - 2 wheels;Cane - single point;Bedside commode      Prior Function Level of Independence: Independent  Hand Dominance   Dominant Hand: Right    Extremity/Trunk Assessment   Upper Extremity Assessment Upper Extremity Assessment: Overall WFL for tasks assessed    Lower Extremity Assessment Lower Extremity Assessment:  RLE deficits/detail RLE Deficits / Details: limited quad activation and notable extensor lag (no immobilizer in room) RLE Sensation: WNL RLE Coordination: decreased gross motor       Communication   Communication: No difficulties  Cognition Arousal/Alertness: Awake/alert Behavior During Therapy: WFL for tasks assessed/performed Overall Cognitive Status: Within Functional Limits for tasks assessed                                        General Comments      Exercises Total Joint Exercises Ankle Circles/Pumps: AROM;Both;20 reps;Seated   Assessment/Plan    PT Assessment Patient needs continued PT services  PT Problem List Decreased strength;Decreased range of motion;Decreased activity tolerance;Decreased balance;Decreased mobility;Decreased knowledge of use of DME;Decreased knowledge of precautions;Pain       PT Treatment Interventions DME instruction;Gait training;Functional mobility training;Stair training;Therapeutic activities;Therapeutic exercise;Balance training;Patient/family education    PT Goals (Current goals can be found in the Care Plan section)  Acute Rehab PT Goals Patient Stated Goal: regain independence PT Goal Formulation: With patient Time For Goal Achievement: 05/19/21 Potential to Achieve Goals: Good    Frequency 7X/week   Barriers to discharge        Co-evaluation               AM-PAC PT "6 Clicks" Mobility  Outcome Measure Help needed turning from your back to your side while in a flat bed without using bedrails?: A Little Help needed moving from lying on your back to sitting on the side of a flat bed without using bedrails?: A Little Help needed moving to and from a bed to a chair (including a wheelchair)?: A Little Help needed standing up from a chair using your arms (e.g., wheelchair or bedside chair)?: A Little Help needed to walk in hospital room?: A Little Help needed climbing 3-5 steps with a railing? : A Lot 6  Click Score: 17    End of Session Equipment Utilized During Treatment: Gait belt Activity Tolerance: Patient tolerated treatment well Patient left: in chair;with call bell/phone within reach;with chair alarm set;with family/visitor present Nurse Communication: Mobility status PT Visit Diagnosis: Muscle weakness (generalized) (M62.81);Difficulty in walking, not elsewhere classified (R26.2)    Time: 4268-3419 PT Time Calculation (min) (ACUTE ONLY): 22 min   Charges:   PT Evaluation $PT Eval Low Complexity: 1 Low          Wynn Maudlin, DPT Acute Rehabilitation Services Office [phone] Pager [phone]    Anitra Lauth 05/12/2021, 7:09 PM

## 2021-05-12 NOTE — Discharge Instructions (Signed)

## 2021-05-12 NOTE — Anesthesia Postprocedure Evaluation (Signed)
Anesthesia Post Note  Patient: Amber Freeman  Procedure(s) Performed: TOTAL KNEE ARTHROPLASTY (Right Knee)     Patient location during evaluation: PACU Anesthesia Type: Spinal Level of consciousness: oriented and awake and alert Pain management: pain level controlled Vital Signs Assessment: post-procedure vital signs reviewed and stable Respiratory status: spontaneous breathing, respiratory function stable and nonlabored ventilation Cardiovascular status: blood pressure returned to baseline and stable Postop Assessment: no headache, no backache, no apparent nausea or vomiting and spinal receding Anesthetic complications: no   No complications documented.  Last Vitals:  Vitals:   05/12/21 1515 05/12/21 1530  BP: (!) 141/66 136/61  Pulse: 69 67  Resp: (!) 9 11  Temp:    SpO2: 100% 100%    Last Pain:  Vitals:   05/12/21 1445  TempSrc:   PainSc: 0-No pain                 Lucretia Kern

## 2021-05-12 NOTE — Op Note (Signed)
NAME:  Amber Freeman                      MEDICAL RECORD NO.:  [id number]                             FACILITY:  [facility]      PHYSICIAN:  Madlyn Frankel. Charlann Boxer, M.D.  DATE OF BIRTH:  Sep 24, 1932      DATE OF PROCEDURE:  05/12/2021                                     OPERATIVE REPORT         PREOPERATIVE DIAGNOSIS:  Right knee osteoarthritis.      POSTOPERATIVE DIAGNOSIS:  Right knee osteoarthritis.      FINDINGS:  The patient was noted to have complete loss of cartilage and   bone-on-bone arthritis with associated osteophytes in the medial and patellofemoral compartments of   the knee with a significant synovitis and associated effusion.  The patient had failed months of conservative treatment including medications, injection therapy, activity modification.     PROCEDURE:  Right total knee replacement.      COMPONENTS USED:  DePuy Attune rotating platform posterior stabilized knee   system, a size 4N femur, 4 tibia, size 7 mm PS AOX insert, and 35 anatomic patellar   button.      SURGEON:  Madlyn Frankel. Charlann Boxer, M.D.      ASSISTANT:  Dennie Bible, PA-C.      ANESTHESIA:  Regional and Spinal.      SPECIMENS:  None.      COMPLICATION:  None.      DRAINS:  None.  EBL: <100cc      TOURNIQUET TIME:   Total Tourniquet Time Documented: Thigh (Right) - 27 minutes Total: Thigh (Right) - 27 minutes  .      The patient was stable to the recovery room.      INDICATION FOR PROCEDURE:  Amber Freeman is a 87 y.o. female patient of   mine.  The patient had been seen, evaluated, and treated for months conservatively in the   office with medication, activity modification, and injections.  The patient had   radiographic changes of bone-on-bone arthritis with endplate sclerosis and osteophytes noted.  Based on the radiographic changes and failed conservative measures, the patient   decided to proceed with definitive treatment, total knee replacement.  Risks of infection, DVT, component failure,  need for revision surgery, neurovascular injury were reviewed in the office setting.  The postop course was reviewed stressing the efforts to maximize post-operative satisfaction and function.  Consent was obtained for benefit of pain   relief.      PROCEDURE IN DETAIL:  The patient was brought to the operative theater.   Once adequate anesthesia, preoperative antibiotics, 2 gm of Ancef,1 gm of Tranexamic Acid, and 10 mg of Decadron administered, the patient was positioned supine with a right thigh tourniquet placed.  The  right lower extremity was prepped and draped in sterile fashion.  A time-   out was performed identifying the patient, planned procedure, and the appropriate extremity.      The right lower extremity was placed in the [facility] leg holder.  The leg was   exsanguinated, tourniquet elevated to 250 mmHg.  A midline incision was   made  followed by median parapatellar arthrotomy.  Following initial   exposure, attention was first directed to the patella.  Precut   measurement was noted to be 22 mm.  I resected down to 14 mm and used a   35 anatomic patellar button to restore patellar height as well as cover the cut surface.      The lug holes were drilled and a metal shim was placed to protect the   patella from retractors and saw blade during the procedure.      At this point, attention was now directed to the femur.  The femoral   canal was opened with a drill, irrigated to try to prevent fat emboli.  An   intramedullary rod was passed at 5 degrees valgus, 9 mm of bone was   resected off the distal femur.  Following this resection, the tibia was   subluxated anteriorly.  Using the extramedullary guide, 2 mm of bone was resected off   the proximal medial tibia.  We confirmed the gap would be   stable medially and laterally with a size 5 spacer block as well as confirmed that the tibial cut was perpendicular in the coronal plane, checking with an alignment rod.      Once this was  done, I sized the femur to be a size 4 in the anterior-   posterior dimension, chose a narrow component based on medial and   lateral dimension.  The size 4 rotation block was then pinned in   position anterior referenced using the C-clamp to set rotation.  The   anterior, posterior, and  chamfer cuts were made without difficulty nor   notching making certain that I was along the anterior cortex to help   with flexion gap stability.      The final box cut was made off the lateral aspect of distal femur.      At this point, the tibia was sized to be a size 4.  The size 4 tray was   then pinned in position through the medial third of the tubercle,   drilled, and keel punched.  Trial reduction was now carried with a 4 femur,  4 tibia, a size 7 mm PS insert, and the 35 anatomic patella botton.  The knee was brought to full extension with good flexion stability with the patella   tracking through the trochlea without application of pressure.  Given   all these findings the trial components removed.  Final components were   opened and cement was mixed.  The knee was irrigated with normal saline solution and pulse lavage.  The synovial lining was   then injected with 30 cc of 0.25% Marcaine with epinephrine, 1 cc of Toradol and 30 cc of NS for a total of 61 cc.     Final implants were then cemented onto cleaned and dried cut surfaces of bone with the knee brought to extension with a size 7 mm PS trial insert.      Once the cement had fully cured, excess cement was removed   throughout the knee.  I confirmed that I was satisfied with the range of   motion and stability, and the final size 7 mm PS AOX insert was chosen.  It was   placed into the knee.      The tourniquet had been let down at 27 minutes.  No significant   hemostasis was required.  The extensor mechanism was then reapproximated using #1 Vicryl and #  1 Stratafix sutures with the knee   in flexion.  The   remaining wound was closed  with 2-0 Vicryl and running 4-0 Monocryl.   The knee was cleaned, dried, dressed sterilely using Dermabond and   Aquacel dressing.  The patient was then   brought to recovery room in stable condition, tolerating the procedure   well.   Please note that Physician Assistant, Dennie Bible, PA-C was present for the entirety of the case, and was utilized for pre-operative positioning, peri-operative retractor management, general facilitation of the procedure and for primary wound closure at the end of the case.              Madlyn Frankel Charlann Boxer, M.D.    05/12/2021 11:25 AM

## 2021-05-13 DIAGNOSIS — M1711 Unilateral primary osteoarthritis, right knee: Secondary | ICD-10-CM | POA: Diagnosis not present

## 2021-05-13 LAB — CBC
HCT: 33.2 % — ABNORMAL LOW (ref 36.0–46.0)
Hemoglobin: 10.9 g/dL — ABNORMAL LOW (ref 12.0–15.0)
MCH: 31.7 pg (ref 26.0–34.0)
MCHC: 32.8 g/dL (ref 30.0–36.0)
MCV: 96.5 fL (ref 80.0–100.0)
Platelets: 156 10*3/uL (ref 150–400)
RBC: 3.44 MIL/uL — ABNORMAL LOW (ref 3.87–5.11)
RDW: 13 % (ref 11.5–15.5)
WBC: 9 10*3/uL (ref 4.0–10.5)
nRBC: 0 % (ref 0.0–0.2)

## 2021-05-13 LAB — BASIC METABOLIC PANEL
Anion gap: 5 (ref 5–15)
BUN: 31 mg/dL — ABNORMAL HIGH (ref 8–23)
CO2: 29 mmol/L (ref 22–32)
Calcium: 9.2 mg/dL (ref 8.9–10.3)
Chloride: 103 mmol/L (ref 98–111)
Creatinine, Ser: 1.23 mg/dL — ABNORMAL HIGH (ref 0.44–1.00)
GFR, Estimated: 43 mL/min — ABNORMAL LOW (ref >60)
Glucose, Bld: 134 mg/dL — ABNORMAL HIGH (ref 70–99)
Potassium: 3.9 mmol/L (ref 3.5–5.1)
Sodium: 137 mmol/L (ref 135–145)

## 2021-05-13 MED ORDER — METHOCARBAMOL 500 MG PO TABS: 500.0000 mg | ORAL_TABLET | Freq: Four times a day (QID) | ORAL | 0 refills | Status: AC | PRN

## 2021-05-13 MED ORDER — HYDROCODONE-ACETAMINOPHEN 5-325 MG PO TABS: 1.0000 | ORAL_TABLET | Freq: Four times a day (QID) | ORAL | 0 refills | Status: DC | PRN

## 2021-05-13 MED ORDER — DOCUSATE SODIUM 100 MG PO CAPS: 100.0000 mg | ORAL_CAPSULE | Freq: Two times a day (BID) | ORAL | 0 refills | Status: AC

## 2021-05-13 MED ORDER — POLYETHYLENE GLYCOL 3350 17 G PO PACK: 17.0000 g | PACK | Freq: Every day | ORAL | 0 refills | Status: AC | PRN

## 2021-05-13 MED ORDER — ASPIRIN 81 MG PO CHEW: 81.0000 mg | CHEWABLE_TABLET | Freq: Two times a day (BID) | ORAL | 0 refills | Status: AC

## 2021-05-13 MED ORDER — HYDROCODONE-ACETAMINOPHEN 5-325 MG PO TABS: 1.0000 | ORAL_TABLET | Freq: Four times a day (QID) | ORAL | 0 refills | Status: AC | PRN

## 2021-05-13 NOTE — Progress Notes (Signed)
Physical Therapy Treatment Patient Details Name: Amber Freeman MRN: [id number] DOB: 10/12/1932 Today's Date: 05/13/2021    History of Present Illness Patient is 87 y.o. female s/p Rt TKA on 05/12/21 with PMH significant for OA, HTN, cataract.    PT Comments    Patient making good progress with mobility and demonstrated good carryover for use of RW with transfers and gait. Pt's daughters present and provided safe guarding with gait and stair mobility.  Cues for sequencing stairs and use of bil hand rails, no overt LOB noted. Reviewed remaining exercises in HEP and safe use of knee immobilizer. Patient was instructed on donning/doffing of KI and instructed on short term use for ambulation/stair mobility to prevent buckling with stair negotiation. Patient educated to remove KI in sitting/supine after safely negotiating stairs into home/second floor. Educated that KI should not be worn to bed and does not need to be used again for mobility. Patient is mobilizing at safe mobility level to return home with assist from family. Acute PT will continue to progress as able.    Follow Up Recommendations  Follow surgeon's recommendation for DC plan and follow-up therapies;Outpatient PT     Equipment Recommendations  None recommended by PT    Recommendations for Other Services       Precautions / Restrictions Precautions Precautions: Fall Restrictions Weight Bearing Restrictions: No RLE Weight Bearing: Weight bearing as tolerated Other Position/Activity Restrictions: WBAT    Mobility  Bed Mobility               General bed mobility comments: pt OOB in recliner at start and end of session.    Transfers Overall transfer level: Needs assistance Equipment used: Rolling walker (2 wheeled) Transfers: Sit to/from Stand Sit to Stand: Min guard         General transfer comment: pt with good recall for hand placement on RW and use of bil UE's for power up. pt demonstrated safe reach back  and Rt knee extension to sit in recliner.  Ambulation/Gait Ambulation/Gait assistance: Min guard;Supervision Gait Distance (Feet): 120 Feet Assistive device: Rolling walker (2 wheeled) Gait Pattern/deviations: Step-to pattern;Decreased stride length;Decreased stance time - right;Decreased weight shift to right Gait velocity: decr   General Gait Details: pt with good recall for step pattern and proximity to RW. pt with knee immobilizer in place to protect/stabilize to prevent buckling. Pt's daughter's present and provided safe guarding for gait with supervision and instruction from therapist.   Stairs Stairs: Yes Stairs assistance: Min guard;Supervision Stair Management: Two rails;Step to pattern;Forwards Number of Stairs: 4 General stair comments: cues for safe step pattern/sequencing "up with good, down with bad". pt's daughters present and provided safe guarding and assist for walker with cues and supervision from therapist.   Wheelchair Mobility    Modified Rankin (Stroke Patients Only)       Balance Overall balance assessment: Needs assistance Sitting-balance support: Feet supported Sitting balance-Leahy Scale: Good     Standing balance support: During functional activity;Bilateral upper extremity supported Standing balance-Leahy Scale: Poor                              Cognition Arousal/Alertness: Awake/alert Behavior During Therapy: WFL for tasks assessed/performed Overall Cognitive Status: Within Functional Limits for tasks assessed  Exercises Total Joint Exercises Ankle Circles/Pumps: AROM;Both;Seated;20 reps Quad Sets: Right;AROM;Seated;10 reps Short Arc Quad: Right;AROM;5 reps;Seated Heel Slides: Right;AROM;5 reps;Seated Hip ABduction/ADduction: Right;AROM;5 reps;Seated    General Comments General comments (skin integrity, edema, etc.): time spent educating pt and family on donning/doffing  knee immobilizer and use for stairs only.      Pertinent Vitals/Pain Pain Assessment: 0-10 Pain Score: 4  Pain Location: Rt knee Pain Descriptors / Indicators: Aching;Burning;Discomfort    Home Living                      Prior Function            PT Goals (current goals can now be found in the care plan section) Acute Rehab PT Goals Patient Stated Goal: regain independence PT Goal Formulation: With patient Time For Goal Achievement: 05/19/21 Potential to Achieve Goals: Good Progress towards PT goals: Progressing toward goals    Frequency    7X/week      PT Plan Current plan remains appropriate    Co-evaluation              AM-PAC PT "6 Clicks" Mobility   Outcome Measure  Help needed turning from your back to your side while in a flat bed without using bedrails?: A Little Help needed moving from lying on your back to sitting on the side of a flat bed without using bedrails?: A Little Help needed moving to and from a bed to a chair (including a wheelchair)?: A Little Help needed standing up from a chair using your arms (e.g., wheelchair or bedside chair)?: A Little Help needed to walk in hospital room?: A Little Help needed climbing 3-5 steps with a railing? : A Lot 6 Click Score: 17    End of Session Equipment Utilized During Treatment: Gait belt Activity Tolerance: Patient tolerated treatment well Patient left: in chair;with call bell/phone within reach;with chair alarm set;with family/visitor present Nurse Communication: Mobility status PT Visit Diagnosis: Muscle weakness (generalized) (M62.81);Difficulty in walking, not elsewhere classified (R26.2)     Time: 5885-0277 PT Time Calculation (min) (ACUTE ONLY): 26 min  Charges:  $Gait Training: 23-37 mins                     Wynn Maudlin, DPT Acute Rehabilitation Services Office [phone] Pager [phone]     Anitra Lauth 05/13/2021, 12:43 PM

## 2021-05-13 NOTE — Progress Notes (Signed)
Physical Therapy Treatment Patient Details Name: Amber Freeman MRN: [id number] DOB: 05-06-1932 Today's Date: 05/13/2021    History of Present Illness Patient is 87 y.o. female s/p Rt TKA on 05/12/21 with PMH significant for OA, HTN, cataract.    PT Comments    Patient is making good progress with acute PT and advanced gait distance to  ~80' with RW and min guard for saftey. Pt continues to demonstrate reduced Rt quad activation but has excellent use of UE's to prevent buckling with use of RW. Pt was educated on supine/seated exercises for HEP and provided handout for review. Will follow up for additional session to complete HEP and progress to stair mobility with family present for training.   Follow Up Recommendations  Follow surgeon's recommendation for DC plan and follow-up therapies;Outpatient PT     Equipment Recommendations  None recommended by PT    Recommendations for Other Services       Precautions / Restrictions Precautions Precautions: Fall Restrictions Weight Bearing Restrictions: No RLE Weight Bearing: Weight bearing as tolerated Other Position/Activity Restrictions: WBAT    Mobility  Bed Mobility Overal bed mobility: Needs Assistance Bed Mobility: Supine to Sit     Supine to sit: HOB elevated;Min guard     General bed mobility comments: pt taking extra time to bring LE's off EOB, no assist or cues needed.    Transfers Overall transfer level: Needs assistance Equipment used: Rolling walker (2 wheeled) Transfers: Sit to/from Stand Sit to Stand: Min assist         General transfer comment: cues for hand placement. pt initiated power up and assist needed to complete rise.  Ambulation/Gait Ambulation/Gait assistance: Min assist Gait Distance (Feet): 80 Feet Assistive device: Rolling walker (2 wheeled) Gait Pattern/deviations: Step-to pattern;Decreased stride length;Decreased stance time - right;Decreased weight shift to right Gait velocity: decr    General Gait Details: Cues for safe step pattern and proximity to RW; pt maintained safe position throughout with min assist for turns. pt continues to demonstrate decresaed Rt quad activation but good use of UE's on RW to prevent buckling.   Stairs             Wheelchair Mobility    Modified Rankin (Stroke Patients Only)       Balance Overall balance assessment: Needs assistance Sitting-balance support: Feet supported Sitting balance-Leahy Scale: Good     Standing balance support: During functional activity;Bilateral upper extremity supported Standing balance-Leahy Scale: Poor                              Cognition Arousal/Alertness: Awake/alert Behavior During Therapy: WFL for tasks assessed/performed Overall Cognitive Status: Within Functional Limits for tasks assessed                                        Exercises Total Joint Exercises Ankle Circles/Pumps: AROM;Both;Seated;20 reps Quad Sets: Right;AROM;Seated;10 reps Short Arc Quad: Right;AROM;5 reps;Seated Heel Slides: Right;AROM;5 reps;Seated Hip ABduction/ADduction: Right;AROM;5 reps;Seated    General Comments        Pertinent Vitals/Pain Pain Assessment: 0-10 Pain Score: 6  Pain Location: Rt knee Pain Descriptors / Indicators: Aching;Burning;Discomfort Pain Intervention(s): Limited activity within patient's tolerance;Monitored during session;Patient requesting pain meds-RN notified;Repositioned;Ice applied    Home Living  Prior Function            PT Goals (current goals can now be found in the care plan section) Acute Rehab PT Goals Patient Stated Goal: regain independence PT Goal Formulation: With patient Time For Goal Achievement: 05/19/21 Potential to Achieve Goals: Good Progress towards PT goals: Progressing toward goals    Frequency    7X/week      PT Plan Current plan remains appropriate    Co-evaluation               AM-PAC PT "6 Clicks" Mobility   Outcome Measure  Help needed turning from your back to your side while in a flat bed without using bedrails?: A Little Help needed moving from lying on your back to sitting on the side of a flat bed without using bedrails?: A Little Help needed moving to and from a bed to a chair (including a wheelchair)?: A Little Help needed standing up from a chair using your arms (e.g., wheelchair or bedside chair)?: A Little Help needed to walk in hospital room?: A Little Help needed climbing 3-5 steps with a railing? : A Lot 6 Click Score: 17    End of Session Equipment Utilized During Treatment: Gait belt Activity Tolerance: Patient tolerated treatment well Patient left: in chair;with call bell/phone within reach;with chair alarm set;with family/visitor present Nurse Communication: Mobility status PT Visit Diagnosis: Muscle weakness (generalized) (M62.81);Difficulty in walking, not elsewhere classified (R26.2)     Time: 5621-3086 PT Time Calculation (min) (ACUTE ONLY): 28 min  Charges:  $Gait Training: 8-22 mins $Therapeutic Exercise: 8-22 mins                     Wynn Maudlin, DPT Acute Rehabilitation Services Office [phone] Pager [phone]     Anitra Lauth 05/13/2021, 10:27 AM

## 2021-05-13 NOTE — TOC Transition Note (Addendum)
Transition of Care [facility]) - CM/SW Discharge Note  Patient Details  Name: Amber Freeman MRN: [id number] Date of Birth: Jul 28, 1932  Transition of Care [facility]) CM/SW Contact:  Sherie Don, LCSW Phone Number: 05/13/2021, 9:28 AM  Clinical Narrative: Patient is expected to discharge home after working with PT. CSW met with patient to review discharge plan and address needs. Patient to discharge home with OPPT through [facility]. Patient has a rolling walker, cane, and toilet riser at home. TOC signing off.  Addendum: TOC notified by RN patient thinks she will need a 3N1. CSW spoke with daughter who is agreeable to a referral to [employer]. CSW made DME referral to [employer]. [employer] to deliver 3N1 to patient's room.  Final next level of care: OP Rehab Barriers to Discharge: No Barriers Identified  Patient Goals and CMS Choice Patient states their goals for this hospitalization and ongoing recovery are:: Discharge home with OPPT through [facility] Medicare.gov Compare Post Acute Care list provided to:: Patient Choice offered to / list presented to : NA  Discharge Plan and Services       DME Arranged: N/A DME Agency: NA  Readmission Risk Interventions No flowsheet data found.

## 2021-05-13 NOTE — Progress Notes (Signed)
   Subjective: 1 Day Post-Op Procedure(s) (LRB): TOTAL KNEE ARTHROPLASTY (Right) Patient reports pain as mild.   Patient seen in rounds with Dr. Charlann Boxer. Patient is well, and has had no acute complaints or problems. No acute events overnight. Foley catheter removed. Patient ambulated 12 feet with PT.  We will continue therapy today.   Objective: Vital signs in last 24 hours: Temp:  [97.5 F (36.4 C)-98.5 F (36.9 C)] 97.7 F (36.5 C) (05/18 0541) Pulse Rate:  [64-86] 79 (05/18 0541) Resp:  [9-20] 17 (05/18 0541) BP: (108-166)/(52-112) 129/58 (05/18 0541) SpO2:  [9 %-100 %] 100 % (05/18 0541) Weight:  [68.1 kg] 68.1 kg (05/17 0756)  Intake/Output from previous day:  Intake/Output Summary (Last 24 hours) at 05/13/2021 0752 Last data filed at 05/13/2021 0600 Gross per 24 hour  Intake 3181.97 ml  Output 1575 ml  Net 1606.97 ml     Intake/Output this shift: No intake/output data recorded.  Labs: Recent Labs    05/13/21 0324  HGB 10.9*   Recent Labs    05/13/21 0324  WBC 9.0  RBC 3.44*  HCT 33.2*  PLT 156   Recent Labs    05/13/21 0324  NA 137  K 3.9  CL 103  CO2 29  BUN 31*  CREATININE 1.23*  GLUCOSE 134*  CALCIUM 9.2   No results for input(s): LABPT, INR in the last 72 hours.  Exam: General - Patient is Alert and Oriented Extremity - Neurologically intact Sensation intact distally Intact pulses distally Dorsiflexion/Plantar flexion intact Dressing - dressing C/D/I Motor Function - intact, moving foot and toes well on exam.   Past Medical History:  Diagnosis Date  . Arthritis   . Cataract    Left eye  . Hypercholesteremia   . Hypertension     Assessment/Plan: 1 Day Post-Op Procedure(s) (LRB): TOTAL KNEE ARTHROPLASTY (Right) Active Problems:   S/P total knee arthroplasty, right  Estimated body mass index is 26.61 kg/m as calculated from the following:   Height as of this encounter: 5\' 3"  (1.6 m).   Weight as of this encounter: 68.1  kg. Advance diet Up with therapy D/C IV fluids   Patient's anticipated LOS is less than 2 midnights, meeting these requirements: - Younger than 50 - Lives within 1 hour of care - Has a competent adult at home to recover with post-op recover - NO history of  - Chronic pain requiring opiods  - Diabetes  - Coronary Artery Disease  - Heart failure  - Heart attack  - Stroke  - DVT/VTE  - Cardiac arrhythmia  - Respiratory Failure/COPD  - Renal failure  - Anemia  - Advanced Liver disease  DVT Prophylaxis - Aspirin Weight bearing as tolerated.  Hemoglobin stable at 10.9 this AM.  Cr 1.23, baseline of 1.28. Will discontinue Celebrex.  Plan is to go Home after hospital stay. Plan for discharge today following 1-2 sessions of PT as long as they are meeting their goals. Patient is scheduled for OPPT. Follow up in the office in 2 weeks.   76, PA-C Orthopedic Surgery [phone] 05/13/2021, 7:52 AM

## 2021-05-14 ENCOUNTER — Encounter (HOSPITAL_COMMUNITY): Payer: Self-pay | Admitting: Orthopedic Surgery

## 2021-05-20 NOTE — Discharge Summary (Signed)
Physician Discharge Summary   Patient ID: ELLAH Freeman MRN: [id number] DOB/AGE: [birth year] 85 y.o.  Admit date: 05/12/2021 Discharge date: 05/13/2021  Primary Diagnosis: Right knee osteoarthritis.   Admission Diagnoses:  Past Medical History:  Diagnosis Date  . Arthritis   . Cataract    Left eye  . Hypercholesteremia   . Hypertension    Discharge Diagnoses:   Active Problems:   S/P total knee arthroplasty, right  Estimated body mass index is 26.61 kg/m as calculated from the following:   Height as of this encounter: 5\' 3"  (1.6 m).   Weight as of this encounter: 68.1 kg.  Procedure:  Procedure(s) (LRB): TOTAL KNEE ARTHROPLASTY (Right)   Consults: None  HPI:  Amber Freeman is a 87 y.o. female patient of   mine.  The patient had been seen, evaluated, and treated for months conservatively in the   office with medication, activity modification, and injections.  The patient had   radiographic changes of bone-on-bone arthritis with endplate sclerosis and osteophytes noted.  Based on the radiographic changes and failed conservative measures, the patient   decided to proceed with definitive treatment, total knee replacement.  Risks of infection, DVT, component failure, need for revision surgery, neurovascular injury were reviewed in the office setting.  The postop course was reviewed stressing the efforts to maximize post-operative satisfaction and function.  Consent was obtained for benefit of pain   relief.   Laboratory Data: Admission on 05/12/2021, Discharged on 05/13/2021  Component Date Value Ref Range Status  . WBC 05/13/2021 9.0  4.0 - 10.5 K/uL Final  . RBC 05/13/2021 3.44* 3.87 - 5.11 MIL/uL Final  . Hemoglobin 05/13/2021 10.9* 12.0 - 15.0 g/dL Final  . HCT 05/15/2021 33.2* 36.0 - 46.0 % Final  . MCV 05/13/2021 96.5  80.0 - 100.0 fL Final  . MCH 05/13/2021 31.7  26.0 - 34.0 pg Final  . MCHC 05/13/2021 32.8  30.0 - 36.0 g/dL Final  . RDW 05/15/2021 13.0  11.5 - 15.5  % Final  . Platelets 05/13/2021 156  150 - 400 K/uL Final  . nRBC 05/13/2021 0.0  0.0 - 0.2 % Final   Performed at [facility], [address] [place], [place]  . Sodium 05/13/2021 137  135 - 145 mmol/L Final  . Potassium 05/13/2021 3.9  3.5 - 5.1 mmol/L Final  . Chloride 05/13/2021 103  98 - 111 mmol/L Final  . CO2 05/13/2021 29  22 - 32 mmol/L Final  . Glucose, Bld 05/13/2021 134* 70 - 99 mg/dL Final   Glucose reference range applies only to samples taken after fasting for at least 8 hours.  . BUN 05/13/2021 31* 8 - 23 mg/dL Final  . Creatinine, Ser 05/13/2021 1.23* 0.44 - 1.00 mg/dL Final  . Calcium 05/15/2021 9.2  8.9 - 10.3 mg/dL Final  . GFR, Estimated 05/13/2021 43* >60 mL/min Final   Comment: (NOTE) Calculated using the CKD-EPI Creatinine Equation (2021)   . Anion gap 05/13/2021 5  5 - 15 Final   Performed at [facility], [address] [place], [place]  Hospital Outpatient Visit on 05/08/2021  Component Date Value Ref Range Status  . SARS Coronavirus 2 05/08/2021 NEGATIVE  NEGATIVE Final   Comment: (NOTE) SARS-CoV-2 target nucleic acids are NOT DETECTED.  The SARS-CoV-2 RNA is generally detectable in upper and lower respiratory specimens during the acute phase of infection. Negative results do not preclude SARS-CoV-2 infection, do not rule out  co-infections with other pathogens, and should not be used as the sole basis for treatment or other patient management decisions. Negative results must be combined with clinical observations, patient history, and epidemiological information. The expected result is Negative.  Fact Sheet for Patients: HairSlick.no  Fact Sheet for Healthcare Providers: quierodirigir.com  This test is not yet approved or cleared by the [place] FDA and  has been authorized for detection and/or diagnosis of SARS-CoV-2 by FDA under  an Emergency Use Authorization (EUA). This EUA will remain  in effect (meaning this test can be used) for the duration of the COVID-19 declaration under Se                          ction 564(b)(1) of the Act, 21 U.S.C. section 360bbb-3(b)(1), unless the authorization is terminated or revoked sooner.  Performed at [facility], [address], [place], [place] [address]   Hospital Outpatient Visit on 05/01/2021  Component Date Value Ref Range Status  . MRSA, PCR 05/01/2021 NEGATIVE  NEGATIVE Final  . Staphylococcus aureus 05/01/2021 NEGATIVE  NEGATIVE Final   Comment: (NOTE) The Xpert SA Assay (FDA approved for NASAL specimens in patients 58 years of age and older), is one component of a comprehensive surveillance program. It is not intended to diagnose infection nor to guide or monitor treatment. Performed at [facility], [address], [place], [place] [address]   . WBC 05/01/2021 5.5  4.0 - 10.5 K/uL Final  . RBC 05/01/2021 4.13  3.87 - 5.11 MIL/uL Final  . Hemoglobin 05/01/2021 13.0  12.0 - 15.0 g/dL Final  . HCT 26/83/4196 41.0  36.0 - 46.0 % Final  . MCV 05/01/2021 99.3  80.0 - 100.0 fL Final  . MCH 05/01/2021 31.5  26.0 - 34.0 pg Final  . MCHC 05/01/2021 31.7  30.0 - 36.0 g/dL Final  . RDW 22/29/7989 13.5  11.5 - 15.5 % Final  . Platelets 05/01/2021 182  150 - 400 K/uL Final  . nRBC 05/01/2021 0.0  0.0 - 0.2 % Final   Performed at [facility], [address], [place], [place] [address]  . Sodium 05/01/2021 142  135 - 145 mmol/L Final  . Potassium 05/01/2021 3.8  3.5 - 5.1 mmol/L Final  . Chloride 05/01/2021 107  98 - 111 mmol/L Final  . CO2 05/01/2021 25  22 - 32 mmol/L Final  . Glucose, Bld 05/01/2021 92  70 - 99 mg/dL Final   Glucose reference range applies only to samples taken after fasting for at least 8 hours.  . BUN 05/01/2021 27* 8 - 23 mg/dL Final  . Creatinine, Ser 05/01/2021 1.28* 0.44 - 1.00 mg/dL Final  .  Calcium 17/40/8144 10.2  8.9 - 10.3 mg/dL Final  . Total Protein 05/01/2021 7.9  6.5 - 8.1 g/dL Final  . Albumin 81/85/6314 4.4  3.5 - 5.0 g/dL Final  . AST 97/01/6377 21  15 - 41 U/L Final  . ALT 05/01/2021 24  0 - 44 U/L Final  . Alkaline Phosphatase 05/01/2021 66  38 - 126 U/L Final  . Total Bilirubin 05/01/2021 0.8  0.3 - 1.2 mg/dL Final  . GFR, Estimated 05/01/2021 41* >60 mL/min Final   Comment: (NOTE) Calculated using the CKD-EPI Creatinine Equation (2021)   . Anion gap 05/01/2021 10  5 - 15 Final   Performed at [facility], [address], [place], [place] [address]  .  Prothrombin Time 05/01/2021 13.3  11.4 - 15.2 seconds Final  . INR 05/01/2021 1.0  0.8 - 1.2 Final   Comment: (NOTE) INR goal varies based on device and disease states. Performed at [facility], [address], [place], [place] [address]   . aPTT 05/01/2021 41* 24 - 36 seconds Final   Comment:        IF BASELINE aPTT IS ELEVATED, SUGGEST PATIENT RISK ASSESSMENT BE USED TO DETERMINE APPROPRIATE ANTICOAGULANT THERAPY. Performed at [facility], [address], [place], [place] [address]   . ABO/RH(D) 05/01/2021 O NEG   Final  . Antibody Screen 05/01/2021 NEG   Final  . Sample Expiration 05/01/2021 05/15/2021,2359   Final  . Extend sample reason 05/01/2021    Final                   Value:NO TRANSFUSIONS OR PREGNANCY IN THE PAST 3 MONTHS Performed at [facility], [address], [place], [place] [address]      X-Rays:No results found.  EKG: Orders placed or performed during the hospital encounter of 05/01/21  . EKG 12 lead per protocol  . EKG 12 lead per protocol     Hospital Course: TANGANYIKA BOWLDS is a 87 y.o. who was admitted to [facility]. They were brought to the operating room on 05/12/2021 and underwent Procedure(s): TOTAL KNEE ARTHROPLASTY.  Patient tolerated the procedure well and was later transferred to  the recovery room and then to the orthopaedic floor for postoperative care. They were given PO and IV analgesics for pain control following their surgery. They were given 24 hours of postoperative antibiotics of  Anti-infectives (From admission, onward)   Start     Dose/Rate Route Frequency Ordered Stop   05/12/21 1645  ceFAZolin (ANCEF) IVPB 2g/100 mL premix        2 g 200 mL/hr over 30 Minutes Intravenous Every 6 hours 05/12/21 1550 05/12/21 2252   05/12/21 0730  ceFAZolin (ANCEF) IVPB 2g/100 mL premix        2 g 200 mL/hr over 30 Minutes Intravenous On call to O.R. 05/12/21 9147 05/12/21 1011     and started on DVT prophylaxis in the form of Aspirin.   PT and OT were ordered for total joint protocol. Discharge planning consulted to help with postop disposition and equipment needs.  Patient had a good night on the evening of surgery. They started to get up OOB with therapy on POD #0. Pt was seen during rounds and was ready to go home pending progress with therapy. She worked with therapy on POD #1 and was meeting her goals. Pt was discharged to home later that day in stable condition.  Diet: Regular diet Activity: WBAT Follow-up: in 2 weeks Disposition: Home Discharged Condition: good   Discharge Instructions    Call MD / Call 911   Complete by: As directed    If you experience chest pain or shortness of breath, CALL 911 and be transported to the hospital emergency room.  If you develope a fever above 101 F, pus (white drainage) or increased drainage or redness at the wound, or calf pain, call your surgeon's office.   Change dressing   Complete by: As directed    Maintain surgical dressing until follow up in the clinic. If the edges start to pull up, may reinforce with tape. If the dressing is no longer working, may remove and cover with gauze and tape, but  must keep the area dry and clean.  Call with any questions or concerns.   Constipation Prevention   Complete by: As directed     Drink plenty of fluids.  Prune juice may be helpful.  You may use a stool softener, such as Colace (over the counter) 100 mg twice a day.  Use MiraLax (over the counter) for constipation as needed.   Diet - low sodium heart healthy   Complete by: As directed    Increase activity slowly as tolerated   Complete by: As directed    Weight bearing as tolerated with assist device (walker, cane, etc) as directed, use it as long as suggested by your surgeon or therapist, typically at least 4-6 weeks.   Post-operative opioid taper instructions:   Complete by: As directed    POST-OPERATIVE OPIOID TAPER INSTRUCTIONS: It is important to wean off of your opioid medication as soon as possible. If you do not need pain medication after your surgery it is ok to stop day one. Opioids include: Codeine, Hydrocodone(Norco, Vicodin), Oxycodone(Percocet, oxycontin) and hydromorphone amongst others.  Long term and even short term use of opiods can cause: Increased pain response Dependence Constipation Depression Respiratory depression And more.  Withdrawal symptoms can include Flu like symptoms Nausea, vomiting And more Techniques to manage these symptoms Hydrate well Eat regular healthy meals Stay active Use relaxation techniques(deep breathing, meditating, yoga) Do Not substitute Alcohol to help with tapering If you have been on opioids for less than two weeks and do not have pain than it is ok to stop all together.  Plan to wean off of opioids This plan should start within one week post op of your joint replacement. Maintain the same interval or time between taking each dose and first decrease the dose.  Cut the total daily intake of opioids by one tablet each day Next start to increase the time between doses. The last dose that should be eliminated is the evening dose.      TED hose   Complete by: As directed    Use stockings (TED hose) for 2 weeks on both leg(s).  You may remove them at night  for sleeping.     Allergies as of 05/13/2021   No Known Allergies     Medication List    STOP taking these medications   acetaminophen 650 MG CR tablet Commonly known as: TYLENOL   meloxicam 15 MG tablet Commonly known as: MOBIC     TAKE these medications   aspirin 81 MG chewable tablet Chew 1 tablet (81 mg total) by mouth 2 (two) times daily for 28 days.   docusate sodium 100 MG capsule Commonly known as: COLACE Take 1 capsule (100 mg total) by mouth 2 (two) times daily.   ezetimibe 10 MG tablet Commonly known as: ZETIA Take 10 mg by mouth daily.   HYDROcodone-acetaminophen 5-325 MG tablet Commonly known as: NORCO/VICODIN Take 1-2 tablets by mouth every 6 (six) hours as needed.   lisinopril-hydrochlorothiazide 20-25 MG tablet Commonly known as: ZESTORETIC Take 1 tablet by mouth daily.   Lumigan 0.01 % Soln Generic drug: bimatoprost Place 1 drop into both eyes at bedtime.   methocarbamol 500 MG tablet Commonly known as: ROBAXIN Take 1 tablet (500 mg total) by mouth every 6 (six) hours as needed for muscle spasms.   polyethylene glycol 17 g packet Commonly known as: MIRALAX / GLYCOLAX Take 17 g by mouth daily as needed for mild constipation.   potassium chloride 10 MEQ  tablet Commonly known as: KLOR-CON Take 10 mEq by mouth daily.   pravastatin 80 MG tablet Commonly known as: PRAVACHOL Take 80 mg by mouth at bedtime.            Discharge Care Instructions  (From admission, onward)         Start     Ordered   05/13/21 0000  Change dressing       Comments: Maintain surgical dressing until follow up in the clinic. If the edges start to pull up, may reinforce with tape. If the dressing is no longer working, may remove and cover with gauze and tape, but must keep the area dry and clean.  Call with any questions or concerns.   05/13/21 0755          Follow-up Information    Durene Romans, MD. Schedule an appointment as soon as possible for a visit  in 2 weeks.   Specialty: Orthopedic Surgery Contact information: [address] [place] [address] [place] [address] [phone]               Signed: Dennie Bible, PA-C Orthopedic Surgery 05/20/2021, 7:47 AM

## 2021-06-26 DIAGNOSIS — Z96651 Presence of right artificial knee joint: Secondary | ICD-10-CM | POA: Diagnosis not present

## 2021-06-26 DIAGNOSIS — Z471 Aftercare following joint replacement surgery: Secondary | ICD-10-CM | POA: Diagnosis not present

## 2021-07-06 DIAGNOSIS — M25561 Pain in right knee: Secondary | ICD-10-CM | POA: Diagnosis not present

## 2021-07-10 DIAGNOSIS — M25561 Pain in right knee: Secondary | ICD-10-CM | POA: Diagnosis not present

## 2021-07-16 DIAGNOSIS — M25561 Pain in right knee: Secondary | ICD-10-CM | POA: Diagnosis not present

## 2021-07-20 DIAGNOSIS — M25561 Pain in right knee: Secondary | ICD-10-CM | POA: Diagnosis not present

## 2021-07-26 DIAGNOSIS — I10 Essential (primary) hypertension: Secondary | ICD-10-CM | POA: Diagnosis not present

## 2021-07-26 DIAGNOSIS — E785 Hyperlipidemia, unspecified: Secondary | ICD-10-CM | POA: Diagnosis not present

## 2021-07-26 DIAGNOSIS — M170 Bilateral primary osteoarthritis of knee: Secondary | ICD-10-CM | POA: Diagnosis not present

## 2021-08-26 DIAGNOSIS — I10 Essential (primary) hypertension: Secondary | ICD-10-CM | POA: Diagnosis not present

## 2021-08-26 DIAGNOSIS — M170 Bilateral primary osteoarthritis of knee: Secondary | ICD-10-CM | POA: Diagnosis not present

## 2021-08-26 DIAGNOSIS — E785 Hyperlipidemia, unspecified: Secondary | ICD-10-CM | POA: Diagnosis not present

## 2021-09-25 DIAGNOSIS — I10 Essential (primary) hypertension: Secondary | ICD-10-CM | POA: Diagnosis not present

## 2021-09-25 DIAGNOSIS — E785 Hyperlipidemia, unspecified: Secondary | ICD-10-CM | POA: Diagnosis not present

## 2021-09-25 DIAGNOSIS — M170 Bilateral primary osteoarthritis of knee: Secondary | ICD-10-CM | POA: Diagnosis not present

## 2021-10-08 DIAGNOSIS — H25042 Posterior subcapsular polar age-related cataract, left eye: Secondary | ICD-10-CM | POA: Diagnosis not present

## 2021-10-08 DIAGNOSIS — H2512 Age-related nuclear cataract, left eye: Secondary | ICD-10-CM | POA: Diagnosis not present

## 2021-10-08 DIAGNOSIS — H25012 Cortical age-related cataract, left eye: Secondary | ICD-10-CM | POA: Diagnosis not present

## 2021-10-08 DIAGNOSIS — H40052 Ocular hypertension, left eye: Secondary | ICD-10-CM | POA: Diagnosis not present

## 2021-10-26 DIAGNOSIS — E785 Hyperlipidemia, unspecified: Secondary | ICD-10-CM | POA: Diagnosis not present

## 2021-10-26 DIAGNOSIS — I10 Essential (primary) hypertension: Secondary | ICD-10-CM | POA: Diagnosis not present

## 2021-10-26 DIAGNOSIS — M170 Bilateral primary osteoarthritis of knee: Secondary | ICD-10-CM | POA: Diagnosis not present

## 2021-10-30 DIAGNOSIS — H2512 Age-related nuclear cataract, left eye: Secondary | ICD-10-CM | POA: Diagnosis not present

## 2021-10-30 DIAGNOSIS — H25012 Cortical age-related cataract, left eye: Secondary | ICD-10-CM | POA: Diagnosis not present

## 2021-10-30 DIAGNOSIS — H25042 Posterior subcapsular polar age-related cataract, left eye: Secondary | ICD-10-CM | POA: Diagnosis not present

## 2021-10-30 DIAGNOSIS — H40052 Ocular hypertension, left eye: Secondary | ICD-10-CM | POA: Diagnosis not present

## 2021-10-30 DIAGNOSIS — Z961 Presence of intraocular lens: Secondary | ICD-10-CM | POA: Diagnosis not present

## 2021-11-05 DIAGNOSIS — E1169 Type 2 diabetes mellitus with other specified complication: Secondary | ICD-10-CM | POA: Diagnosis not present

## 2021-11-05 DIAGNOSIS — M170 Bilateral primary osteoarthritis of knee: Secondary | ICD-10-CM | POA: Diagnosis not present

## 2021-11-05 DIAGNOSIS — E785 Hyperlipidemia, unspecified: Secondary | ICD-10-CM | POA: Diagnosis not present

## 2021-11-05 DIAGNOSIS — I10 Essential (primary) hypertension: Secondary | ICD-10-CM | POA: Diagnosis not present

## 2021-11-12 DIAGNOSIS — I10 Essential (primary) hypertension: Secondary | ICD-10-CM | POA: Diagnosis not present

## 2021-11-12 DIAGNOSIS — M130 Polyarthritis, unspecified: Secondary | ICD-10-CM | POA: Diagnosis not present

## 2021-11-12 DIAGNOSIS — Z23 Encounter for immunization: Secondary | ICD-10-CM | POA: Diagnosis not present

## 2021-11-12 DIAGNOSIS — E785 Hyperlipidemia, unspecified: Secondary | ICD-10-CM | POA: Diagnosis not present

## 2021-11-12 DIAGNOSIS — M5459 Other low back pain: Secondary | ICD-10-CM | POA: Diagnosis not present

## 2021-11-12 DIAGNOSIS — Z96651 Presence of right artificial knee joint: Secondary | ICD-10-CM | POA: Diagnosis not present

## 2021-11-25 DIAGNOSIS — E785 Hyperlipidemia, unspecified: Secondary | ICD-10-CM | POA: Diagnosis not present

## 2021-11-25 DIAGNOSIS — M170 Bilateral primary osteoarthritis of knee: Secondary | ICD-10-CM | POA: Diagnosis not present

## 2021-11-25 DIAGNOSIS — I10 Essential (primary) hypertension: Secondary | ICD-10-CM | POA: Diagnosis not present

## 2021-12-16 DIAGNOSIS — H268 Other specified cataract: Secondary | ICD-10-CM | POA: Diagnosis not present

## 2021-12-16 DIAGNOSIS — H2512 Age-related nuclear cataract, left eye: Secondary | ICD-10-CM | POA: Diagnosis not present

## 2021-12-16 DIAGNOSIS — H25012 Cortical age-related cataract, left eye: Secondary | ICD-10-CM | POA: Diagnosis not present

## 2021-12-16 DIAGNOSIS — H5703 Miosis: Secondary | ICD-10-CM | POA: Diagnosis not present

## 2021-12-16 DIAGNOSIS — H25042 Posterior subcapsular polar age-related cataract, left eye: Secondary | ICD-10-CM | POA: Diagnosis not present

## 2021-12-25 DIAGNOSIS — I10 Essential (primary) hypertension: Secondary | ICD-10-CM | POA: Diagnosis not present

## 2021-12-25 DIAGNOSIS — M170 Bilateral primary osteoarthritis of knee: Secondary | ICD-10-CM | POA: Diagnosis not present

## 2021-12-25 DIAGNOSIS — E785 Hyperlipidemia, unspecified: Secondary | ICD-10-CM | POA: Diagnosis not present

## 2022-01-24 DIAGNOSIS — M170 Bilateral primary osteoarthritis of knee: Secondary | ICD-10-CM | POA: Diagnosis not present

## 2022-01-24 DIAGNOSIS — E785 Hyperlipidemia, unspecified: Secondary | ICD-10-CM | POA: Diagnosis not present

## 2022-01-24 DIAGNOSIS — I10 Essential (primary) hypertension: Secondary | ICD-10-CM | POA: Diagnosis not present

## 2022-02-19 DIAGNOSIS — H40052 Ocular hypertension, left eye: Secondary | ICD-10-CM | POA: Diagnosis not present

## 2022-02-23 DIAGNOSIS — I10 Essential (primary) hypertension: Secondary | ICD-10-CM | POA: Diagnosis not present

## 2022-02-23 DIAGNOSIS — E785 Hyperlipidemia, unspecified: Secondary | ICD-10-CM | POA: Diagnosis not present

## 2022-02-23 DIAGNOSIS — M170 Bilateral primary osteoarthritis of knee: Secondary | ICD-10-CM | POA: Diagnosis not present

## 2022-03-15 DIAGNOSIS — H40052 Ocular hypertension, left eye: Secondary | ICD-10-CM | POA: Diagnosis not present

## 2022-03-26 DIAGNOSIS — E785 Hyperlipidemia, unspecified: Secondary | ICD-10-CM | POA: Diagnosis not present

## 2022-03-26 DIAGNOSIS — M170 Bilateral primary osteoarthritis of knee: Secondary | ICD-10-CM | POA: Diagnosis not present

## 2022-03-26 DIAGNOSIS — I10 Essential (primary) hypertension: Secondary | ICD-10-CM | POA: Diagnosis not present

## 2022-04-25 DIAGNOSIS — E785 Hyperlipidemia, unspecified: Secondary | ICD-10-CM | POA: Diagnosis not present

## 2022-04-25 DIAGNOSIS — I10 Essential (primary) hypertension: Secondary | ICD-10-CM | POA: Diagnosis not present

## 2022-05-26 DIAGNOSIS — E785 Hyperlipidemia, unspecified: Secondary | ICD-10-CM | POA: Diagnosis not present

## 2022-05-26 DIAGNOSIS — I10 Essential (primary) hypertension: Secondary | ICD-10-CM | POA: Diagnosis not present

## 2022-07-26 DIAGNOSIS — I10 Essential (primary) hypertension: Secondary | ICD-10-CM | POA: Diagnosis not present

## 2022-07-26 DIAGNOSIS — M170 Bilateral primary osteoarthritis of knee: Secondary | ICD-10-CM | POA: Diagnosis not present

## 2022-07-26 DIAGNOSIS — E785 Hyperlipidemia, unspecified: Secondary | ICD-10-CM | POA: Diagnosis not present

## 2022-08-06 DIAGNOSIS — R7303 Prediabetes: Secondary | ICD-10-CM | POA: Diagnosis not present

## 2022-08-06 DIAGNOSIS — M130 Polyarthritis, unspecified: Secondary | ICD-10-CM | POA: Diagnosis not present

## 2022-08-06 DIAGNOSIS — I10 Essential (primary) hypertension: Secondary | ICD-10-CM | POA: Diagnosis not present

## 2022-08-06 DIAGNOSIS — M5459 Other low back pain: Secondary | ICD-10-CM | POA: Diagnosis not present

## 2022-08-06 DIAGNOSIS — E785 Hyperlipidemia, unspecified: Secondary | ICD-10-CM | POA: Diagnosis not present

## 2022-08-12 DIAGNOSIS — Z0000 Encounter for general adult medical examination without abnormal findings: Secondary | ICD-10-CM | POA: Diagnosis not present

## 2022-08-12 DIAGNOSIS — M170 Bilateral primary osteoarthritis of knee: Secondary | ICD-10-CM | POA: Diagnosis not present

## 2022-08-12 DIAGNOSIS — E785 Hyperlipidemia, unspecified: Secondary | ICD-10-CM | POA: Diagnosis not present

## 2022-08-12 DIAGNOSIS — I10 Essential (primary) hypertension: Secondary | ICD-10-CM | POA: Diagnosis not present

## 2022-08-26 DIAGNOSIS — I10 Essential (primary) hypertension: Secondary | ICD-10-CM | POA: Diagnosis not present

## 2022-08-26 DIAGNOSIS — M170 Bilateral primary osteoarthritis of knee: Secondary | ICD-10-CM | POA: Diagnosis not present

## 2022-08-26 DIAGNOSIS — E785 Hyperlipidemia, unspecified: Secondary | ICD-10-CM | POA: Diagnosis not present

## 2022-09-13 DIAGNOSIS — H40052 Ocular hypertension, left eye: Secondary | ICD-10-CM | POA: Diagnosis not present

## 2022-09-13 DIAGNOSIS — H40012 Open angle with borderline findings, low risk, left eye: Secondary | ICD-10-CM | POA: Diagnosis not present

## 2022-09-17 DIAGNOSIS — R799 Abnormal finding of blood chemistry, unspecified: Secondary | ICD-10-CM | POA: Diagnosis not present

## 2022-09-23 DIAGNOSIS — I10 Essential (primary) hypertension: Secondary | ICD-10-CM | POA: Diagnosis not present

## 2022-09-23 DIAGNOSIS — E7800 Pure hypercholesterolemia, unspecified: Secondary | ICD-10-CM | POA: Diagnosis not present

## 2022-09-25 DIAGNOSIS — E785 Hyperlipidemia, unspecified: Secondary | ICD-10-CM | POA: Diagnosis not present

## 2022-09-25 DIAGNOSIS — I10 Essential (primary) hypertension: Secondary | ICD-10-CM | POA: Diagnosis not present

## 2022-11-25 DIAGNOSIS — I10 Essential (primary) hypertension: Secondary | ICD-10-CM | POA: Diagnosis not present

## 2022-11-25 DIAGNOSIS — E785 Hyperlipidemia, unspecified: Secondary | ICD-10-CM | POA: Diagnosis not present

## 2022-11-25 DIAGNOSIS — M170 Bilateral primary osteoarthritis of knee: Secondary | ICD-10-CM | POA: Diagnosis not present

## 2022-12-25 DIAGNOSIS — M170 Bilateral primary osteoarthritis of knee: Secondary | ICD-10-CM | POA: Diagnosis not present

## 2022-12-25 DIAGNOSIS — E785 Hyperlipidemia, unspecified: Secondary | ICD-10-CM | POA: Diagnosis not present

## 2022-12-25 DIAGNOSIS — I10 Essential (primary) hypertension: Secondary | ICD-10-CM | POA: Diagnosis not present

## 2023-01-12 DIAGNOSIS — I10 Essential (primary) hypertension: Secondary | ICD-10-CM | POA: Diagnosis not present

## 2023-01-12 DIAGNOSIS — E785 Hyperlipidemia, unspecified: Secondary | ICD-10-CM | POA: Diagnosis not present

## 2023-01-12 DIAGNOSIS — M170 Bilateral primary osteoarthritis of knee: Secondary | ICD-10-CM | POA: Diagnosis not present

## 2023-01-31 DIAGNOSIS — E7800 Pure hypercholesterolemia, unspecified: Secondary | ICD-10-CM | POA: Diagnosis not present

## 2023-01-31 DIAGNOSIS — I10 Essential (primary) hypertension: Secondary | ICD-10-CM | POA: Diagnosis not present

## 2023-01-31 DIAGNOSIS — E785 Hyperlipidemia, unspecified: Secondary | ICD-10-CM | POA: Diagnosis not present

## 2023-02-23 DIAGNOSIS — Z1231 Encounter for screening mammogram for malignant neoplasm of breast: Secondary | ICD-10-CM | POA: Diagnosis not present

## 2023-02-24 DIAGNOSIS — I10 Essential (primary) hypertension: Secondary | ICD-10-CM | POA: Diagnosis not present

## 2023-02-24 DIAGNOSIS — M170 Bilateral primary osteoarthritis of knee: Secondary | ICD-10-CM | POA: Diagnosis not present

## 2023-02-24 DIAGNOSIS — E785 Hyperlipidemia, unspecified: Secondary | ICD-10-CM | POA: Diagnosis not present

## 2023-03-15 DIAGNOSIS — H35372 Puckering of macula, left eye: Secondary | ICD-10-CM | POA: Diagnosis not present

## 2023-03-15 DIAGNOSIS — H524 Presbyopia: Secondary | ICD-10-CM | POA: Diagnosis not present

## 2023-03-15 DIAGNOSIS — H40012 Open angle with borderline findings, low risk, left eye: Secondary | ICD-10-CM | POA: Diagnosis not present

## 2023-03-15 DIAGNOSIS — H35362 Drusen (degenerative) of macula, left eye: Secondary | ICD-10-CM | POA: Diagnosis not present

## 2023-03-15 DIAGNOSIS — H40053 Ocular hypertension, bilateral: Secondary | ICD-10-CM | POA: Diagnosis not present

## 2023-03-27 DIAGNOSIS — E785 Hyperlipidemia, unspecified: Secondary | ICD-10-CM | POA: Diagnosis not present

## 2023-03-27 DIAGNOSIS — M170 Bilateral primary osteoarthritis of knee: Secondary | ICD-10-CM | POA: Diagnosis not present

## 2023-03-27 DIAGNOSIS — I10 Essential (primary) hypertension: Secondary | ICD-10-CM | POA: Diagnosis not present

## 2023-04-11 DIAGNOSIS — H40053 Ocular hypertension, bilateral: Secondary | ICD-10-CM | POA: Diagnosis not present

## 2023-04-11 DIAGNOSIS — H40012 Open angle with borderline findings, low risk, left eye: Secondary | ICD-10-CM | POA: Diagnosis not present

## 2023-04-26 DIAGNOSIS — E785 Hyperlipidemia, unspecified: Secondary | ICD-10-CM | POA: Diagnosis not present

## 2023-04-26 DIAGNOSIS — I10 Essential (primary) hypertension: Secondary | ICD-10-CM | POA: Diagnosis not present

## 2023-04-26 DIAGNOSIS — M170 Bilateral primary osteoarthritis of knee: Secondary | ICD-10-CM | POA: Diagnosis not present

## 2023-06-06 DIAGNOSIS — Z6824 Body mass index (BMI) 24.0-24.9, adult: Secondary | ICD-10-CM | POA: Diagnosis not present

## 2023-06-06 DIAGNOSIS — I10 Essential (primary) hypertension: Secondary | ICD-10-CM | POA: Diagnosis not present

## 2023-06-06 DIAGNOSIS — E1169 Type 2 diabetes mellitus with other specified complication: Secondary | ICD-10-CM | POA: Diagnosis not present

## 2023-06-06 DIAGNOSIS — M130 Polyarthritis, unspecified: Secondary | ICD-10-CM | POA: Diagnosis not present

## 2023-06-06 DIAGNOSIS — R634 Abnormal weight loss: Secondary | ICD-10-CM | POA: Diagnosis not present

## 2023-07-18 DIAGNOSIS — E1169 Type 2 diabetes mellitus with other specified complication: Secondary | ICD-10-CM | POA: Diagnosis not present

## 2023-07-18 DIAGNOSIS — E785 Hyperlipidemia, unspecified: Secondary | ICD-10-CM | POA: Diagnosis not present

## 2023-07-18 DIAGNOSIS — I10 Essential (primary) hypertension: Secondary | ICD-10-CM | POA: Diagnosis not present

## 2023-09-08 DIAGNOSIS — H40053 Ocular hypertension, bilateral: Secondary | ICD-10-CM | POA: Diagnosis not present

## 2023-09-08 DIAGNOSIS — H40012 Open angle with borderline findings, low risk, left eye: Secondary | ICD-10-CM | POA: Diagnosis not present

## 2023-10-06 DIAGNOSIS — M4316 Spondylolisthesis, lumbar region: Secondary | ICD-10-CM | POA: Diagnosis not present

## 2023-10-06 DIAGNOSIS — M47816 Spondylosis without myelopathy or radiculopathy, lumbar region: Secondary | ICD-10-CM | POA: Diagnosis not present

## 2023-10-06 DIAGNOSIS — M7918 Myalgia, other site: Secondary | ICD-10-CM | POA: Diagnosis not present

## 2023-10-20 DIAGNOSIS — M1611 Unilateral primary osteoarthritis, right hip: Secondary | ICD-10-CM | POA: Diagnosis not present

## 2023-10-20 DIAGNOSIS — M25551 Pain in right hip: Secondary | ICD-10-CM | POA: Diagnosis not present

## 2023-10-20 DIAGNOSIS — M5459 Other low back pain: Secondary | ICD-10-CM | POA: Diagnosis not present

## 2023-10-27 DIAGNOSIS — M5459 Other low back pain: Secondary | ICD-10-CM | POA: Diagnosis not present

## 2023-10-27 DIAGNOSIS — M1611 Unilateral primary osteoarthritis, right hip: Secondary | ICD-10-CM | POA: Diagnosis not present

## 2023-10-27 DIAGNOSIS — M25551 Pain in right hip: Secondary | ICD-10-CM | POA: Diagnosis not present

## 2023-10-31 DIAGNOSIS — M1611 Unilateral primary osteoarthritis, right hip: Secondary | ICD-10-CM | POA: Diagnosis not present

## 2023-10-31 DIAGNOSIS — M25551 Pain in right hip: Secondary | ICD-10-CM | POA: Diagnosis not present

## 2023-10-31 DIAGNOSIS — M5459 Other low back pain: Secondary | ICD-10-CM | POA: Diagnosis not present

## 2023-11-02 DIAGNOSIS — E1169 Type 2 diabetes mellitus with other specified complication: Secondary | ICD-10-CM | POA: Diagnosis not present

## 2023-11-02 DIAGNOSIS — E785 Hyperlipidemia, unspecified: Secondary | ICD-10-CM | POA: Diagnosis not present

## 2023-11-02 DIAGNOSIS — M130 Polyarthritis, unspecified: Secondary | ICD-10-CM | POA: Diagnosis not present

## 2023-11-02 DIAGNOSIS — I10 Essential (primary) hypertension: Secondary | ICD-10-CM | POA: Diagnosis not present

## 2023-11-03 DIAGNOSIS — M1611 Unilateral primary osteoarthritis, right hip: Secondary | ICD-10-CM | POA: Diagnosis not present

## 2023-11-03 DIAGNOSIS — M5459 Other low back pain: Secondary | ICD-10-CM | POA: Diagnosis not present

## 2023-11-03 DIAGNOSIS — M25551 Pain in right hip: Secondary | ICD-10-CM | POA: Diagnosis not present

## 2023-11-08 DIAGNOSIS — M25551 Pain in right hip: Secondary | ICD-10-CM | POA: Diagnosis not present

## 2023-11-08 DIAGNOSIS — M5459 Other low back pain: Secondary | ICD-10-CM | POA: Diagnosis not present

## 2023-11-08 DIAGNOSIS — M1611 Unilateral primary osteoarthritis, right hip: Secondary | ICD-10-CM | POA: Diagnosis not present

## 2023-11-18 DIAGNOSIS — M47816 Spondylosis without myelopathy or radiculopathy, lumbar region: Secondary | ICD-10-CM | POA: Diagnosis not present

## 2023-11-23 ENCOUNTER — Other Ambulatory Visit: Payer: Self-pay | Admitting: Orthopaedic Surgery

## 2023-11-23 DIAGNOSIS — M4316 Spondylolisthesis, lumbar region: Secondary | ICD-10-CM

## 2023-11-28 DIAGNOSIS — M1611 Unilateral primary osteoarthritis, right hip: Secondary | ICD-10-CM | POA: Diagnosis not present

## 2023-11-28 DIAGNOSIS — M5459 Other low back pain: Secondary | ICD-10-CM | POA: Diagnosis not present

## 2023-11-28 DIAGNOSIS — M25551 Pain in right hip: Secondary | ICD-10-CM | POA: Diagnosis not present

## 2023-11-30 DIAGNOSIS — M25551 Pain in right hip: Secondary | ICD-10-CM | POA: Diagnosis not present

## 2023-11-30 DIAGNOSIS — M1611 Unilateral primary osteoarthritis, right hip: Secondary | ICD-10-CM | POA: Diagnosis not present

## 2023-11-30 DIAGNOSIS — M5459 Other low back pain: Secondary | ICD-10-CM | POA: Diagnosis not present

## 2023-12-08 DIAGNOSIS — M25551 Pain in right hip: Secondary | ICD-10-CM | POA: Diagnosis not present

## 2023-12-08 DIAGNOSIS — M5459 Other low back pain: Secondary | ICD-10-CM | POA: Diagnosis not present

## 2023-12-08 DIAGNOSIS — M1611 Unilateral primary osteoarthritis, right hip: Secondary | ICD-10-CM | POA: Diagnosis not present

## 2023-12-14 DIAGNOSIS — M1611 Unilateral primary osteoarthritis, right hip: Secondary | ICD-10-CM | POA: Diagnosis not present

## 2023-12-14 DIAGNOSIS — M25551 Pain in right hip: Secondary | ICD-10-CM | POA: Diagnosis not present

## 2023-12-14 DIAGNOSIS — M5459 Other low back pain: Secondary | ICD-10-CM | POA: Diagnosis not present

## 2023-12-16 DIAGNOSIS — M1611 Unilateral primary osteoarthritis, right hip: Secondary | ICD-10-CM | POA: Diagnosis not present

## 2023-12-16 DIAGNOSIS — M5459 Other low back pain: Secondary | ICD-10-CM | POA: Diagnosis not present

## 2023-12-16 DIAGNOSIS — M25551 Pain in right hip: Secondary | ICD-10-CM | POA: Diagnosis not present

## 2023-12-23 ENCOUNTER — Ambulatory Visit
Admission: RE | Admit: 2023-12-23 | Discharge: 2023-12-23 | Disposition: A | Discharge status: Home / Self Care | Payer: Medicare Other | Source: Ambulatory Visit | Attending: Orthopaedic Surgery | Admitting: Orthopaedic Surgery

## 2023-12-23 DIAGNOSIS — M4316 Spondylolisthesis, lumbar region: Secondary | ICD-10-CM

## 2023-12-23 DIAGNOSIS — M48061 Spinal stenosis, lumbar region without neurogenic claudication: Secondary | ICD-10-CM | POA: Diagnosis not present

## 2023-12-23 DIAGNOSIS — M5136 Other intervertebral disc degeneration, lumbar region with discogenic back pain only: Secondary | ICD-10-CM | POA: Diagnosis not present

## 2023-12-23 DIAGNOSIS — M47816 Spondylosis without myelopathy or radiculopathy, lumbar region: Secondary | ICD-10-CM | POA: Diagnosis not present

## 2023-12-29 DIAGNOSIS — M47816 Spondylosis without myelopathy or radiculopathy, lumbar region: Secondary | ICD-10-CM | POA: Diagnosis not present

## 2024-01-18 DIAGNOSIS — M47816 Spondylosis without myelopathy or radiculopathy, lumbar region: Secondary | ICD-10-CM | POA: Diagnosis not present

## 2024-02-08 DIAGNOSIS — M47816 Spondylosis without myelopathy or radiculopathy, lumbar region: Secondary | ICD-10-CM | POA: Diagnosis not present

## 2024-02-23 DIAGNOSIS — E785 Hyperlipidemia, unspecified: Secondary | ICD-10-CM | POA: Diagnosis not present

## 2024-02-23 DIAGNOSIS — R7303 Prediabetes: Secondary | ICD-10-CM | POA: Diagnosis not present

## 2024-02-23 DIAGNOSIS — I10 Essential (primary) hypertension: Secondary | ICD-10-CM | POA: Diagnosis not present

## 2024-02-29 DIAGNOSIS — M47816 Spondylosis without myelopathy or radiculopathy, lumbar region: Secondary | ICD-10-CM | POA: Diagnosis not present

## 2024-03-02 DIAGNOSIS — E7800 Pure hypercholesterolemia, unspecified: Secondary | ICD-10-CM | POA: Diagnosis not present

## 2024-03-02 DIAGNOSIS — M5459 Other low back pain: Secondary | ICD-10-CM | POA: Diagnosis not present

## 2024-03-02 DIAGNOSIS — I10 Essential (primary) hypertension: Secondary | ICD-10-CM | POA: Diagnosis not present

## 2024-03-02 DIAGNOSIS — M130 Polyarthritis, unspecified: Secondary | ICD-10-CM | POA: Diagnosis not present

## 2024-03-26 DIAGNOSIS — H524 Presbyopia: Secondary | ICD-10-CM | POA: Diagnosis not present

## 2024-03-26 DIAGNOSIS — M47816 Spondylosis without myelopathy or radiculopathy, lumbar region: Secondary | ICD-10-CM | POA: Diagnosis not present

## 2024-03-26 DIAGNOSIS — H35362 Drusen (degenerative) of macula, left eye: Secondary | ICD-10-CM | POA: Diagnosis not present

## 2024-03-26 DIAGNOSIS — H401131 Primary open-angle glaucoma, bilateral, mild stage: Secondary | ICD-10-CM | POA: Diagnosis not present

## 2024-03-26 DIAGNOSIS — H35372 Puckering of macula, left eye: Secondary | ICD-10-CM | POA: Diagnosis not present

## 2024-04-18 DIAGNOSIS — Z1231 Encounter for screening mammogram for malignant neoplasm of breast: Secondary | ICD-10-CM | POA: Diagnosis not present

## 2024-05-11 NOTE — Progress Notes (Signed)
   05/11/2024  Patient ID: Amber Freeman, female   DOB: 06-25-1932, 89 y.o.   MRN: [id number]  Reviewed patient regarding medication adherence from a quality report for [facility]. The patient failed [facility] in 2024.     Per [facility] and payor portal fill history: Lisinopril -Hydrochlorothiazide  20-25 mg - last filled 04/11/24 for a 90-day supply.     I will follow up for adherence monitoring.   Thank you for allowing pharmacy to be a part of this patient's care.    Livia Riffle, PharmD Clinical Pharmacist  [phone]

## 2024-07-02 DIAGNOSIS — H401131 Primary open-angle glaucoma, bilateral, mild stage: Secondary | ICD-10-CM | POA: Diagnosis not present

## 2024-09-10 DIAGNOSIS — M5459 Other low back pain: Secondary | ICD-10-CM | POA: Diagnosis not present

## 2024-09-10 DIAGNOSIS — I10 Essential (primary) hypertension: Secondary | ICD-10-CM | POA: Diagnosis not present

## 2024-09-10 DIAGNOSIS — M130 Polyarthritis, unspecified: Secondary | ICD-10-CM | POA: Diagnosis not present

## 2024-09-10 DIAGNOSIS — E1169 Type 2 diabetes mellitus with other specified complication: Secondary | ICD-10-CM | POA: Diagnosis not present

## 2024-09-20 DIAGNOSIS — E7800 Pure hypercholesterolemia, unspecified: Secondary | ICD-10-CM | POA: Diagnosis not present

## 2024-09-20 DIAGNOSIS — I10 Essential (primary) hypertension: Secondary | ICD-10-CM | POA: Diagnosis not present

## 2024-09-20 DIAGNOSIS — Z23 Encounter for immunization: Secondary | ICD-10-CM | POA: Diagnosis not present
